# Patient Record
Sex: Female | Born: 1965
Health system: Southern US, Community
[De-identification: ages and names within clinical notes are randomized; demographics above are authoritative.]

## PROBLEM LIST (undated history)

## (undated) DIAGNOSIS — E7800 Pure hypercholesterolemia, unspecified: Secondary | ICD-10-CM

## (undated) DIAGNOSIS — D239 Other benign neoplasm of skin, unspecified: Secondary | ICD-10-CM

## (undated) DIAGNOSIS — J329 Chronic sinusitis, unspecified: Secondary | ICD-10-CM

## (undated) DIAGNOSIS — K219 Gastro-esophageal reflux disease without esophagitis: Secondary | ICD-10-CM

## (undated) DIAGNOSIS — F329 Major depressive disorder, single episode, unspecified: Secondary | ICD-10-CM

## (undated) DIAGNOSIS — R51 Headache: Secondary | ICD-10-CM

## (undated) DIAGNOSIS — F411 Generalized anxiety disorder: Secondary | ICD-10-CM

## (undated) DIAGNOSIS — F3289 Other specified depressive episodes: Secondary | ICD-10-CM

## (undated) DIAGNOSIS — C801 Malignant (primary) neoplasm, unspecified: Secondary | ICD-10-CM

## (undated) DIAGNOSIS — A158 Other respiratory tuberculosis: Secondary | ICD-10-CM

## (undated) DIAGNOSIS — IMO0002 Reserved for concepts with insufficient information to code with codable children: Secondary | ICD-10-CM

## (undated) HISTORY — DX: Other specified depressive episodes: F32.89

## (undated) HISTORY — PX: TONSILLECTOMY: SUR1361

## (undated) HISTORY — DX: Other respiratory tuberculosis: A15.8

## (undated) HISTORY — DX: Chronic sinusitis, unspecified: J32.9

## (undated) HISTORY — DX: Generalized anxiety disorder: F41.1

## (undated) HISTORY — DX: Other benign neoplasm of skin, unspecified: D23.9

## (undated) HISTORY — DX: Gastro-esophageal reflux disease without esophagitis: K21.9

## (undated) HISTORY — DX: Malignant (primary) neoplasm, unspecified: C80.1

## (undated) HISTORY — PX: VARICOSE VEIN SURGERY: SHX832

## (undated) HISTORY — DX: Reserved for concepts with insufficient information to code with codable children: IMO0002

## (undated) HISTORY — DX: Pure hypercholesterolemia, unspecified: E78.00

## (undated) HISTORY — PX: ADENOIDECTOMY: SUR15

## (undated) HISTORY — DX: Headache: R51

## (undated) HISTORY — DX: Major depressive disorder, single episode, unspecified: F32.9

---
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
## 1998-01-17 HISTORY — PX: COLPOSCOPY: SHX161

## 1998-01-17 HISTORY — PX: VARICOSE VEIN SURGERY: SHX832

## 2003-01-18 DIAGNOSIS — IMO0002 Reserved for concepts with insufficient information to code with codable children: Secondary | ICD-10-CM

## 2003-01-18 DIAGNOSIS — R87619 Unspecified abnormal cytological findings in specimens from cervix uteri: Secondary | ICD-10-CM

## 2003-01-18 HISTORY — DX: Reserved for concepts with insufficient information to code with codable children: IMO0002

## 2003-01-18 HISTORY — DX: Unspecified abnormal cytological findings in specimens from cervix uteri: R87.619

## 2004-02-18 ENCOUNTER — Encounter: Payer: Self-pay | Admitting: Family Medicine

## 2004-02-18 LAB — CONVERTED CEMR LAB: Pap Smear: NORMAL

## 2005-11-03 ENCOUNTER — Ambulatory Visit: Payer: Self-pay | Admitting: Family Medicine

## 2005-12-13 ENCOUNTER — Ambulatory Visit: Payer: Self-pay | Admitting: Family Medicine

## 2006-01-09 ENCOUNTER — Ambulatory Visit: Payer: Self-pay | Admitting: Internal Medicine

## 2006-01-19 ENCOUNTER — Encounter: Payer: Self-pay | Admitting: Obstetrics & Gynecology

## 2006-01-19 ENCOUNTER — Ambulatory Visit: Payer: Self-pay | Admitting: Obstetrics & Gynecology

## 2006-09-22 ENCOUNTER — Ambulatory Visit: Payer: Self-pay | Admitting: Family Medicine

## 2006-09-22 DIAGNOSIS — A158 Other respiratory tuberculosis: Secondary | ICD-10-CM | POA: Insufficient documentation

## 2006-09-22 DIAGNOSIS — K219 Gastro-esophageal reflux disease without esophagitis: Secondary | ICD-10-CM | POA: Insufficient documentation

## 2006-09-22 DIAGNOSIS — E7800 Pure hypercholesterolemia, unspecified: Secondary | ICD-10-CM | POA: Insufficient documentation

## 2006-09-22 DIAGNOSIS — D239 Other benign neoplasm of skin, unspecified: Secondary | ICD-10-CM | POA: Insufficient documentation

## 2006-10-27 DIAGNOSIS — D239 Other benign neoplasm of skin, unspecified: Secondary | ICD-10-CM

## 2006-10-27 HISTORY — DX: Other benign neoplasm of skin, unspecified: D23.9

## 2006-12-22 ENCOUNTER — Encounter (INDEPENDENT_AMBULATORY_CARE_PROVIDER_SITE_OTHER): Payer: Self-pay | Admitting: *Deleted

## 2006-12-25 ENCOUNTER — Encounter (INDEPENDENT_AMBULATORY_CARE_PROVIDER_SITE_OTHER): Payer: Self-pay | Admitting: *Deleted

## 2006-12-26 LAB — CONVERTED CEMR LAB
Albumin: 4 g/dL (ref 3.5–5.2)
BUN: 12 mg/dL (ref 6–23)
Bilirubin, Direct: 0.1 mg/dL (ref 0.0–0.3)
CO2: 27 meq/L (ref 19–32)
Chloride: 107 meq/L (ref 96–112)
Cholesterol: 221 mg/dL (ref 0–200)
Creatinine, Ser: 0.7 mg/dL (ref 0.4–1.2)
Direct LDL: 150.8 mg/dL
GFR calc Af Amer: 120 mL/min
HDL: 32.5 mg/dL — ABNORMAL LOW (ref >39.0)
Total CHOL/HDL Ratio: 6.8
Triglycerides: 158 mg/dL — ABNORMAL HIGH (ref 0–149)

## 2007-02-07 ENCOUNTER — Ambulatory Visit: Payer: Self-pay | Admitting: Obstetrics & Gynecology

## 2007-02-07 ENCOUNTER — Encounter (INDEPENDENT_AMBULATORY_CARE_PROVIDER_SITE_OTHER): Payer: Self-pay | Admitting: Gynecology

## 2007-02-12 ENCOUNTER — Inpatient Hospital Stay (HOSPITAL_COMMUNITY): Admission: AD | Admit: 2007-02-12 | Discharge: 2007-02-12 | Payer: Self-pay | Admitting: Obstetrics and Gynecology

## 2007-02-20 ENCOUNTER — Ambulatory Visit (HOSPITAL_COMMUNITY): Admission: RE | Admit: 2007-02-20 | Discharge: 2007-02-20 | Payer: Self-pay | Admitting: Gynecology

## 2007-03-12 ENCOUNTER — Ambulatory Visit: Payer: Self-pay | Admitting: Gynecology

## 2007-03-21 ENCOUNTER — Encounter: Payer: Self-pay | Admitting: Family Medicine

## 2007-03-21 DIAGNOSIS — F411 Generalized anxiety disorder: Secondary | ICD-10-CM | POA: Insufficient documentation

## 2007-03-22 ENCOUNTER — Ambulatory Visit: Payer: Self-pay | Admitting: Family Medicine

## 2007-03-23 ENCOUNTER — Encounter: Payer: Self-pay | Admitting: Family Medicine

## 2007-05-07 ENCOUNTER — Telehealth: Payer: Self-pay | Admitting: Family Medicine

## 2007-10-02 ENCOUNTER — Ambulatory Visit: Payer: Self-pay | Admitting: Family Medicine

## 2007-11-02 DIAGNOSIS — D229 Melanocytic nevi, unspecified: Secondary | ICD-10-CM

## 2007-11-02 HISTORY — DX: Melanocytic nevi, unspecified: D22.9

## 2007-12-06 ENCOUNTER — Telehealth: Payer: Self-pay | Admitting: Family Medicine

## 2008-03-12 ENCOUNTER — Encounter: Payer: Self-pay | Admitting: Family Medicine

## 2008-03-12 ENCOUNTER — Ambulatory Visit: Payer: Self-pay | Admitting: Family Medicine

## 2008-03-17 ENCOUNTER — Encounter: Admission: RE | Admit: 2008-03-17 | Discharge: 2008-03-17 | Payer: Self-pay | Admitting: Family Medicine

## 2008-03-17 ENCOUNTER — Ambulatory Visit (HOSPITAL_COMMUNITY): Admission: RE | Admit: 2008-03-17 | Discharge: 2008-03-17 | Payer: Self-pay | Admitting: Obstetrics & Gynecology

## 2008-03-27 ENCOUNTER — Encounter: Admission: RE | Admit: 2008-03-27 | Discharge: 2008-03-27 | Payer: Self-pay | Admitting: Family Medicine

## 2008-07-29 ENCOUNTER — Ambulatory Visit: Payer: Self-pay | Admitting: Family Medicine

## 2008-08-06 ENCOUNTER — Encounter: Payer: Self-pay | Admitting: Family Medicine

## 2008-08-07 ENCOUNTER — Encounter: Admission: RE | Admit: 2008-08-07 | Discharge: 2008-08-07 | Payer: Self-pay | Admitting: Family Medicine

## 2008-08-07 ENCOUNTER — Ambulatory Visit: Payer: Self-pay | Admitting: Family Medicine

## 2008-09-24 ENCOUNTER — Encounter: Admission: RE | Admit: 2008-09-24 | Discharge: 2008-09-24 | Payer: Self-pay | Admitting: Family Medicine

## 2009-02-17 LAB — CONVERTED CEMR LAB: Pap Smear: NORMAL

## 2009-03-17 ENCOUNTER — Ambulatory Visit: Payer: Self-pay | Admitting: Obstetrics and Gynecology

## 2009-03-23 ENCOUNTER — Encounter: Admission: RE | Admit: 2009-03-23 | Discharge: 2009-03-23 | Payer: Self-pay | Admitting: Obstetrics and Gynecology

## 2009-09-01 ENCOUNTER — Ambulatory Visit: Payer: Self-pay | Admitting: Family Medicine

## 2009-09-01 DIAGNOSIS — R002 Palpitations: Secondary | ICD-10-CM | POA: Insufficient documentation

## 2009-09-22 ENCOUNTER — Encounter (INDEPENDENT_AMBULATORY_CARE_PROVIDER_SITE_OTHER): Payer: Self-pay | Admitting: *Deleted

## 2009-09-22 ENCOUNTER — Encounter: Admission: RE | Admit: 2009-09-22 | Discharge: 2009-09-22 | Payer: Self-pay | Admitting: Obstetrics and Gynecology

## 2009-09-29 ENCOUNTER — Ambulatory Visit: Payer: Self-pay | Admitting: Family Medicine

## 2009-10-28 ENCOUNTER — Telehealth: Payer: Self-pay | Admitting: Family Medicine

## 2009-11-17 ENCOUNTER — Ambulatory Visit: Payer: Self-pay | Admitting: Cardiovascular Disease

## 2010-02-16 NOTE — Progress Notes (Signed)
Summary: wants referral to cardiologist  Phone Note Call from Patient Call back at Home Phone [phone]   Caller: Patient Call For: Kerby Nora MD Summary of Call: Pt is asking for referral to cardiologist for palpitations.  She says she has already discussed this with you.  She prefers to see someone in [place].   Knows you are out till friday. Initial call taken by: Lowella Petties CMA,  October 28, 2009 4:57 PM

## 2010-02-16 NOTE — Assessment & Plan Note (Signed)
Summary: ROA FOR 1 MONTH FOLLOW-UP/JRR   Vital Signs:  Patient profile:   45 year old female Height:      64.5 inches Weight:      150 pounds BMI:     25.44 Temp:     97.9 degrees F oral Pulse rate:   72 / minute Pulse rhythm:   regular BP sitting:   112 / 72  (left arm) Cuff size:   regular  Vitals Entered By: Linde Gillis CMA Duncan Dull) (September 29, 2009 3:55 PM)  History of Present Illness: Chief complaint 1 month follow up  Anxiety, on 40 mg citalopram daily...she feels minimal improvement on higher dose. Sleeping well at night.    Palpitations: No further episodes in past month.  Has made spme change in diet, decrease caffeine.  Recently had [facility] done at [facility]  Total 233 triglycerides 233   Problems Prior to Update: 1)  Palpitations  (ICD-785.1) 2)  Headache, Occipital  (ICD-784.0) 3)  Depression  (ICD-311) 4)  Anxiety  (ICD-300.00) 5)  Nevus  (ICD-216.9) 6)  Gerd  (ICD-530.81) 7)  Postnasal Drip Syndrome  (ICD-473.9) 8)  Tb, Respiratory Nec, Histologic Dx  (ICD-012.85) 9)  Hypercholesterolemia  (ICD-272.0)  Current Medications (verified): 1)  Citalopram Hydrobromide 20 Mg  Tabs (Citalopram Hydrobromide) .... 2 By Mouth Once Daily  Allergies (verified): No Known Drug Allergies  Past History:  Past medical, surgical, family and social histories (including risk factors) reviewed, and no changes noted (except as noted below).  Past Medical History: Reviewed history from 03/21/2007 and no changes required. HEADACHE,? MIGRAINE (ICD-784.0) DEPRESSION (ICD-311) ANXIETY (ICD-300.00) NEVUS (ICD-216.9) GERD (ICD-530.81) POSTNASAL DRIP SYNDROME (ICD-473.9) TB, RESPIRATORY NEC, HISTOLOGIC DX (ICD-012.85) HYPERCHOLESTEROLEMIA (ICD-272.0)    Past Surgical History: Reviewed history from 03/21/2007 and no changes required. 2000    Varicose vein removal 1979     Tonsillectomy 2000     Colposcopy - abn. Pap.  Family History: Reviewed history from  08/07/2008 and no changes required. Father: Alive 56, HTN, DM, high chol. Mother: Alive 19, healthy Siblings: 1 brother, healthy No MI < 55 PGF:  Pancreatic CA MGF:  Oral CA   MGM: CAD, stents, in 63's  Social History: Reviewed history from 03/21/2007 and no changes required. Never Smoked Alcohol use-no Drug use-no Regular exercise-yes, walks daily Marital Status: Married x 13 years, no abuse Children: 3 kids, ages 51-10 Occupation: Homemaker Diet:  3 meals, limited fruit and veggies, sweet iced tea  Review of Systems General:  Complains of fatigue; denies fever. CV:  Denies chest pain or discomfort. Resp:  Denies shortness of breath. GI:  Denies abdominal pain. GU:  Denies dysuria.  Physical Exam  General:  Well-developed,well-nourished,in no acute distress; alert,appropriate and cooperative throughout examination Mouth:  Oral mucosa and oropharynx without lesions or exudates.  Teeth in good repair. Neck:  no carotid bruit or thyromegaly no cervical or supraclavicular lymphadenopathy  Lungs:  Normal respiratory effort, chest expands symmetrically. Lungs are clear to auscultation, no crackles or wheezes. Heart:  Normal rate and regular rhythm. S1 and S2 normal without gallop, murmur, click, rub or other extra sounds. Pulses:  R and L posterior tibial pulses are full and equal bilaterally  Extremities:  no edmea  Skin:  Intact without suspicious lesions or rashes Psych:  slightly anxious.  Oriented X3, memory intact for recent and remote, normally interactive, and good eye contact.     Impression & Recommendations:  Problem # 1:  ANXIETY (ICD-300.00) stable control on curent dose  med. if control worsens, she will consider changinf medicaiton. Discussed longterm paln for medication..she feels she will need to continue longterm given family history of anxiety issues.  Her updated medication list for this problem includes:    Citalopram Hydrobromide 20 Mg Tabs (Citalopram  hydrobromide) .Marland Kitchen... 2 by mouth once daily  Problem # 2:  PALPITATIONS (ICD-785.1) resolved.   Problem # 3:  HYPERCHOLESTEROLEMIA (ICD-272.0) Fish oil 2000 mg Divided daily  Decrease fatty foods.  Increase exercise. Increase almonds, change to olive oil/canola, try avacado. Change to 2% cheese, or mazarella fat free. Return for fasting lipids in 3 months.   Complete Medication List: 1)  Citalopram Hydrobromide 20 Mg Tabs (Citalopram hydrobromide) .... 2 by mouth once daily  Other Orders: Admin 1st Vaccine (34742) Flu Vaccine 54yrs + (59563)  Patient Instructions: 1)  Fish oil 2000 mg Divided daily 2)   Decrease fatty foods. 3)   Increase exercise. 4)  Increase almonds, change to olive oil/canola, try avacado. 5)  Change to 2% cheese, or mazarella fat free. 6)  Return for fasting lipids in 3 months.  7)  Folow up appt to disucss chol and anxiety in 3 months.  8)   GoaL LDL< 130-160. 9)   Conitnue citalopram.   Current Allergies (reviewed today): No known allergies   Last Mammogram:  BI-RADS CATEGORY 3:  Probably benign finding(s) - short interval^MM DIGITAL DIAGNOSTIC UNILAT L (09/24/2008 10:27:00 AM) Mammogram Result Date:  09/17/2009 Mammogram Result:  left abnormal Mammogram Next Due:  6 mo                    Flu Vaccine Consent Questions     Do you have a history of severe allergic reactions to this vaccine? no    Any prior history of allergic reactions to egg and/or gelatin? no    Do you have a sensitivity to the preservative Thimersol? no    Do you have a past history of Guillan-Barre Syndrome? no    Do you currently have an acute febrile illness? no    Have you ever had a severe reaction to latex? no    Vaccine information given and explained to patient? yes    Are you currently pregnant? no    Lot Number:[id number]   Exp Date:07/17/2010   Site Given  Left Deltoid IM .lbflu

## 2010-02-16 NOTE — Assessment & Plan Note (Signed)
Summary: IRREGULAR HEARTBEAT/PALPITATIONS/DLO   Vital Signs:  Patient profile:   45 year old female Height:      64.5 inches Weight:      154.8 pounds BMI:     26.26 Temp:     97.9 degrees F oral Pulse rate:   72 / minute Pulse rhythm:   regular BP sitting:   120 / 78  (left arm) Cuff size:   regular  Vitals Entered By: Benny Lennert CMA Duncan Dull) (September 01, 2009 2:37 PM)  History of Present Illness: Chief complaint irregular heart beat and palpitations   Intermitant heart fluttering, pounding...last seconds to minutes.  Increasing frequency in last few months. Occuring daily.  Notices it most at night.  No clear triggers..minimal caffeine intake. No decongestant use. No relation to exercsie. No CP no SOB. No dizziness No sweating or other associated symptoms.  No change in EKG from comparison to 07/2008.  Mother and MGM  with MVP.   Hx of anxiety and depression, moderate control.? citalopram not helping as much as in past.   Problems Prior to Update: 1)  Ear Pain, Left  (ICD-388.70) 2)  Arm Pain, Left  (ICD-729.5) 3)  Neck Pain  (ICD-723.1) 4)  Headache, Occipital  (ICD-784.0) 5)  Uri  (ICD-465.9) 6)  Chest Pain, Atypical  (ICD-786.59) 7)  Headache,? Migraine  (ICD-784.0) 8)  Depression  (ICD-311) 9)  Anxiety  (ICD-300.00) 10)  Nevus  (ICD-216.9) 11)  Gerd  (ICD-530.81) 12)  Postnasal Drip Syndrome  (ICD-473.9) 13)  Tb, Respiratory Nec, Histologic Dx  (ICD-012.85) 14)  Hypercholesterolemia  (ICD-272.0)  Current Medications (verified): 1)  Citalopram Hydrobromide 20 Mg  Tabs (Citalopram Hydrobromide) .... 2 By Mouth Once Daily 2)  Multivitamins   Tabs (Multiple Vitamin) .... Once Daily (Sometimes)  Allergies (verified): No Known Drug Allergies PMH-FH-SH reviewed-no changes except otherwise noted  Review of Systems General:  Complains of fatigue; denies fever. CV:  Denies chest pain or discomfort and swelling of feet. Resp:  Denies shortness of  breath. GI:  Denies abdominal pain. GU:  Denies dysuria.  Physical Exam  General:  Well-developed,well-nourished,in no acute distress; alert,appropriate and cooperative throughout examination Mouth:  Oral mucosa and oropharynx without lesions or exudates.  Teeth in good repair. Neck:  no carotid bruit or thyromegaly no cervical or supraclavicular lymphadenopathy  Lungs:  Normal respiratory effort, chest expands symmetrically. Lungs are clear to auscultation, no crackles or wheezes. Heart:  Normal rate and regular rhythm. S1 and S2 normal without gallop, murmur, click, rub or other extra sounds. Abdomen:  Bowel sounds positive,abdomen soft and non-tender without masses, organomegaly or hernias noted. Pulses:  R and L posterior tibial pulses are full and equal bilaterally  Extremities:  no edmea  Skin:  Intact without suspicious lesions or rashes Psych:  slightly anxious.     Impression & Recommendations:  Problem # 1:  PALPITATIONS (ICD-785.1)  Orders: EKG w/ Interpretation (93000): No arrythmia, no ST changes, no Q, no LVH Recommended decreasing caffeine, no decongestants. Will eval with labs for anemia, thyroid issues as well as CV risk factors such as chol and DM screen.  Treat anxiety as below. If  palpitations/skipped beats continuing...refer to cardiology for Holter Monitor.   Problem # 2:  ANXIETY (ICD-300.00) Moderate control. Increse citalopram to 40 mg daily. Follow up in 1 month. Work on stress reduction, relaxation.  Her updated medication list for this problem includes:    Citalopram Hydrobromide 20 Mg Tabs (Citalopram hydrobromide) .Marland Kitchen... 2 by mouth once daily  Complete Medication List: 1)  Citalopram Hydrobromide 20 Mg Tabs (Citalopram hydrobromide) .... 2 by mouth once daily 2)  Multivitamins Tabs (Multiple vitamin) .... Once daily (sometimes)  Patient Instructions: 1)  Avoid caffeine, decongestants 2)   Increase fluid intake of water. 3)   Increase to 40 mg daily  of citalopram. 4)  Schedule fasting labs... CMET, LIPIDs, TSH, cbc Dx 785.1 5)  Please schedule a follow-up appointment in 1 month.   Current Allergies (reviewed today): No known allergies   Last PAP:  Normal (02/18/2004 5:09:56 PM) PAP Result Date:  02/17/2009 PAP Result:  normal PAP Next Due:  1 yr

## 2010-02-16 NOTE — Letter (Signed)
Summary: [place] No Show Letter  [place] [facility]  [address] [place], [place] [address]   Phone: [phone]  Fax: [phone]    09/22/2009 MRN: [id number]  Janet Porter [address] [place], [place]  [address]   Dear [name],   Our records indicate that you missed your scheduled appointment with ___lab__________________ on __9.6.11__________.  Please contact this office to reschedule your appointment as soon as possible.  It is important that you keep your scheduled appointments with your physician, so we can provide you the best care possible.  Please be advised that there may be a charge for "no show" appointments.    Sincerely,   [place] [facility]

## 2010-02-16 NOTE — Assessment & Plan Note (Signed)
Summary: palpitations      Allergies Added: NKDA  Visit Type:  Initial Consult Primary Provider:  Kerby Nora MD  CC:  c/o fluttering in chest with pounding at times.  Denies shortness of breath..  History of Present Illness: Ms. Janet Porter is a very pleasant 45 year old woman with a history of anxiety who presents for inpatient evaluation for symptoms of irregular heartbeat and palpitations.  She reports that this year, she has noticed palpitations. She describes it as a fluttering, sometimes as a pounding, other times as a flip flopping. She is not had any this week. She notices them more at nighttime though occasionally during the daytime. She has significant stress in her life, though also describes herself as a person who stresses about almost everything. She denies heavy caffeine use, cold medicines, smoking. She is otherwise active and has no symptoms of shortness of breath or chest discomfort. No significant lower extremity edema. She is otherwise healthy.  Her parents have no significant cardiac history her father does have high cholesterol.  She reports that her cholesterol is elevated and notes indicate a total cholesterol of 220 in 2008. She has a "sweet tooth ".  EKG shows normal sinus rhythm with rate 82 beats per minute, no significant ST or T wave changes  Current Medications (verified): 1)  Citalopram Hydrobromide 20 Mg  Tabs (Citalopram Hydrobromide) .... 2 By Mouth Once Daily  Allergies (verified): No Known Drug Allergies  Past History:  Past Medical History: Last updated: 03/21/2007 HEADACHE,? MIGRAINE (ICD-784.0) DEPRESSION (ICD-311) ANXIETY (ICD-300.00) NEVUS (ICD-216.9) GERD (ICD-530.81) POSTNASAL DRIP SYNDROME (ICD-473.9) TB, RESPIRATORY NEC, HISTOLOGIC DX (ICD-012.85) HYPERCHOLESTEROLEMIA (ICD-272.0)    Past Surgical History: Last updated: 03/21/2007 2000    Varicose vein removal 1979     Tonsillectomy 2000     Colposcopy - abn. Pap.  Family  History: Last updated: 08/07/2008 Father: Alive 54, HTN, DM, high chol. Mother: Alive 7, healthy Siblings: 1 brother, healthy No MI < 55 PGF:  Pancreatic CA MGF:  Oral CA   MGM: CAD, stents, in 94's  Social History: Last updated: 03/21/2007 Never Smoked Alcohol use-no Drug use-no Regular exercise-yes, walks daily Marital Status: Married x 13 years, no abuse Children: 3 kids, ages 59-10 Occupation: Homemaker Diet:  3 meals, limited fruit and veggies, sweet iced tea  Risk Factors: Exercise: yes (03/21/2007)  Risk Factors: Smoking Status: never (03/21/2007)  Review of Systems  The patient denies fever, weight loss, weight gain, vision loss, decreased hearing, hoarseness, chest pain, syncope, dyspnea on exertion, peripheral edema, prolonged cough, abdominal pain, incontinence, muscle weakness, depression, and enlarged lymph nodes.         palpitations, fluttering  Vital Signs:  Patient profile:   45 year old female Height:      64.5 inches Weight:      152.25 pounds BMI:     25.82 Pulse rate:   87 / minute BP sitting:   144 / 85  (left arm) Cuff size:   regular  Vitals Entered By: Bishop Dublin, CMA (November 17, 2009 3:45 PM)  Physical Exam  General:  Well developed, well nourished, in no acute distress. Head:  normocephalic and atraumatic Neck:  Neck supple, no JVD. No masses, thyromegaly or abnormal cervical nodes. Lungs:  Clear bilaterally to auscultation and percussion. Heart:  Non-displaced PMI, chest non-tender; regular rate and rhythm, S1, S2 without murmurs, rubs or gallops. Carotid upstroke normal, no bruit.  Pedals normal pulses. No edema, no varicosities. Abdomen:  Bowel sounds positive;  abdomen soft and non-tender without masses Msk:  Back normal, normal gait. Muscle strength and tone normal. Pulses:  pulses normal in all 4 extremities Extremities:  No clubbing or cyanosis. Neurologic:  Alert and oriented x 3. Skin:  Intact without lesions or  rashes. Psych:  Normal affect.   Impression & Recommendations:  Problem # 1:  PALPITATIONS (ICD-785.1) history of sporadic palpitations starting this year. Improved recently. I did discuss the options for her including watchful waiting, treatment with a low-dose beta blocker for symptom relief or a Holter monitor for 24 or 48 hours to identify her underlying arrhythmia. I suspect she is having either APCs or VPCs which are typically benign in nature. We did offer a monitor if she would like and beta blockers to be taken on an as needed basis 4 days with significant palpitations. She preferred to hold off on any testing at this time and did not want medications at this time. I have asked her to contact me If she has worsening symptoms and quickly we could organize a Holter monitor or give her samples of beta blockers if needed.  Orders: EKG w/ Interpretation (93000)  Problem # 2:  HYPERCHOLESTEROLEMIA (ICD-272.0) resistance and on talking about her cholesterol. Her mildly elevated last roll is likely familial. She's not significantly obese. She does not have other risk factors such as smoking or diabetes. I did suggest that she could try aggressive diet and exercise first in March for her cholesterol with a goal total cholesterol less than 200.  Alternatively, she could take a low-dose generic statin if she chose which would likely offer significantly improved lipid control. Past surgical and tachypnea she like me to help her started low dose statin medication such as simvastatin or Lipitor when it goes generic.

## 2010-03-09 ENCOUNTER — Other Ambulatory Visit: Payer: Self-pay | Admitting: Family Medicine

## 2010-03-09 DIAGNOSIS — Z1231 Encounter for screening mammogram for malignant neoplasm of breast: Secondary | ICD-10-CM

## 2010-03-29 ENCOUNTER — Ambulatory Visit
Admission: RE | Admit: 2010-03-29 | Discharge: 2010-03-29 | Disposition: A | Discharge status: Home / Self Care | Payer: BC Managed Care – PPO | Source: Ambulatory Visit | Attending: Family Medicine | Admitting: Family Medicine

## 2010-03-29 DIAGNOSIS — Z1231 Encounter for screening mammogram for malignant neoplasm of breast: Secondary | ICD-10-CM

## 2010-04-14 ENCOUNTER — Ambulatory Visit (INDEPENDENT_AMBULATORY_CARE_PROVIDER_SITE_OTHER): Payer: BC Managed Care – PPO | Admitting: Obstetrics & Gynecology

## 2010-04-14 DIAGNOSIS — Z113 Encounter for screening for infections with a predominantly sexual mode of transmission: Secondary | ICD-10-CM

## 2010-04-14 DIAGNOSIS — Z1272 Encounter for screening for malignant neoplasm of vagina: Secondary | ICD-10-CM

## 2010-04-14 DIAGNOSIS — Z01419 Encounter for gynecological examination (general) (routine) without abnormal findings: Secondary | ICD-10-CM

## 2010-04-20 NOTE — Assessment & Plan Note (Signed)
Janet Porter, Janet Porter               ACCOUNT NO.:  [id number]  MEDICAL RECORD NO.:  [id number]           PATIENT TYPE:  LOCATION:  [facility]           FACILITY:  PHYSICIAN:  Tinnie Gens, MD             DATE OF BIRTH:  DATE OF SERVICE:                                 CLINIC NOTE  CHIEF COMPLAINT:  Yearly exam and Pap.  HISTORY OF PRESENT ILLNESS:  The patient is a 45 year old gravida 3, para 3, who comes in today for annual exam.  She has no any specific complaint.  She is sexually active.  She and her husband use condoms for birth control.  She has regular cycles with no heavy bleeding.  PAST MEDICAL HISTORY:  Negative.  PAST SURGICAL HISTORY:  She had tonsillectomy and adenoidectomy and varicose vein treatment.  MEDICATIONS:  She is on citalopram 20 mg daily and multivitamin once daily.  OBSTETRICAL HISTORY:  She has three previous vaginal deliveries.  GYN HISTORY:  History of abnormal Pap in 2005 with colpo and normal Pap since that time.  SOCIAL HISTORY:  She cleans houses for living.  She lives with her husband and three kids.  She does not smoke, drink, or use any other drugs.  REVIEW OF SYSTEMS:  Fourteen-point review of systems is reviewed.  She denies headache, vision changes, sore throat, nausea, vomiting, diarrhea, constipation, abdominal pain, blood in her stools, blood in her urine, masses, dysuria, vaginal discharge, swelling of feet or ankles, fevers or chills.  PHYSICAL EXAMINATION:  VITAL SIGNS:  Her blood pressure today is 129/75 and weight 149. GENERAL:  She is a well-developed, well-nourished female in no acute distress. LUNGS:  Clear bilaterally. HEENT:  Normocephalic and atraumatic.  Sclerae anicteric. NECK:  Supple.  Normal thyroid. LUNGS:  Clear bilaterally. CV:  Regular rate and rhythm without gallops, murmurs,or rubs. ABDOMEN:  Soft, nontender, nondistended. EXTREMITIES:  She has no cyanosis, clubbing, or edema.  2+  distal pulses. BREASTS:  Symmetric with everted nipples.  No masses.  No supraclavicular or axillary adenopathy. GU:  Normal external female genitalia.  BUS is normal.  Vagina is pink and rugated.  Cervix is parous without lesion.  Adnexa without masses or tenderness.  IMPRESSION:  GYN exam with Pap.  PLAN: 1. Pap smear today. 2. She actually had a mammogram 2 weeks ago.  We will try to obtain     report of this.  She has a primary care physician who does her     yearly labs, Dr. Ermalene Searing, and we will defer those to her, although     she does have a history of elevated cholesterol and potentially     needs followup of that.  Additionally, she is always needing every     6 months interval mammography because of breast thickening in the     area.          ______________________________ Tinnie Gens, MD    TP/MEDQ  D:  04/14/2010  T:  04/15/2010  Job:  [id number]

## 2010-06-01 NOTE — Assessment & Plan Note (Signed)
Janet Porter, STAUFFER NO.:  [id number]   MEDICAL RECORD NO.:  [id number]          PATIENT TYPE:  POB   LOCATION:  [facility]         FACILITY:  [facility]   PHYSICIAN:  Catalina Antigua, MD     DATE OF BIRTH:  [birth year]   DATE OF SERVICE:                                  CLINIC NOTE   This is a 45 year old G3, P3 who presents today for annual exam.  The  patient is currently without any complaints.  She denies any abnormal  bleeding or discharge.  The patient is sexually active with her husband  and is using condoms for birth control.   PAST MEDICAL HISTORY:  She denies.   PAST SURGICAL HISTORY:  Significant for varicose vein and T and A.   PAST OBSTETRICAL HISTORY:  She has had 3 vaginal deliveries.   PAST GYNECOLOGIC HISTORY:  She denies any cyst or fibroid.  She does  have a history of abnormal Pap smear in 2005 which was treated with  colposcopy and has been normal since.   SOCIAL HISTORY:  She denies drinking, smoking or any use of illicit  drugs.   FAMILY HISTORY:  Significant for diabetes, hypertension and colon cancer  in her grandparents.   REVIEW OF SYSTEMS:  Otherwise within normal limits.   PHYSICAL EXAMINATION:  VITAL SIGNS:  Her blood pressure is 102/72, pulse  of 76, weight of 153 pounds, height of 5 feet 3 inches.  LUNGS:  Clear to auscultation bilaterally.  HEART:  Regular rate and rhythm.  BREASTS:  Equal in size.  No palpable masses or tenderness or  lymphadenopathy.  No expressible nipple discharge.  No skin dimpling.  ABDOMEN:  Soft, nontender, nondistended.  PELVIC:  She had normal vaginal mucosa and normal-appearing cervix.  No  abnormal bleeding or discharge.  On bimanual exam, she has small  palpable uterus.  No palpable adnexal masses or tenderness.   ASSESSMENT AND PLAN:  This is a 45 year old gravida 3, para 3 with LMP  of February 24, 2009, who presents for annual exam.  Pap smear was  performed.  The patient already  has a 11-month followup appointment for  her  mammogram next Monday secondary to BIRADS 3 found in September 2010.  The patient will be contacted with any abnormal results and will return  in a year or p.r.n.           ______________________________  Catalina Antigua, MD     PC/MEDQ  D:  03/17/2009  T:  03/18/2009  Job:  [id number]

## 2010-06-01 NOTE — Assessment & Plan Note (Signed)
Janet Porter, Janet Porter NO.:  [id number]   MEDICAL RECORD NO.:  [id number]          PATIENT TYPE:  POB   LOCATION:  [facility] at [facility]         FACILITY:  [facility]   PHYSICIAN:  Tinnie Gens, MD        DATE OF BIRTH:  08-21-1965   DATE OF SERVICE:                                  CLINIC NOTE   CHIEF COMPLAINT:  Physical exam.   HISTORY OF PRESENT ILLNESS:  The patient is a 45 year old para 3,  gravida 3, para 3, who comes today for yearly exam.  She has moderately  heavy cycles over the last 7-10 days.  She has a known endometrial polyp  with some biopsy that was normal.  She was evaluated with a  sonohystogram in the past.  She reports that her bleeding may or may not  be too bothersome to her.  She currently uses condoms for birth control  and was without significant other complaints.   PAST MEDICAL HISTORY:  Negative.   PAST SURGICAL HISTORY:  Tonsils, adenoids, and varicose vein surgery.   MEDICATIONS:  She is on citalopram 20 mg p.o. daily.   ALLERGIES:  None known.   OBSTETRICAL HISTORY:  G3, P3, 3 vaginal deliveries.   GYN HISTORY:  Menarche at age 36.  Cycles are monthly, last 7-10 days.  She does have a history of abnormal Pap.  She had a colposcopy and a  repeat Pap in June 2005 or in 2003.  Her last mammogram in 2005 was  normal.   FAMILY HISTORY:  Significant for diabetes, hypertension, and colon  cancer in her grandparents.   SOCIAL HISTORY:  She cleans houses.  She lives with her husband and 3  kids.  She does not smoke, drink, or use any other drugs.  A 14-point  review of systems reviewed.  She denies headache, vision changes, sore  throat, nausea, vomiting, diarrhea, constipation, or abdominal pain.  She has occasional IBS type symptoms with constipation predominant, but  not anything that is different for her.  No blood in her stools or blood  in urine.  No dysuria.  No vaginal discharge and swelling of feet or  ankles.  No breast masses  or tenderness.   PHYSICAL EXAMINATION:  VITAL SIGNS:  Her vitals are as noted in the  chart.  Weight is 150, and blood pressure 124/79.  GENERAL:  She is a well-developed and well-nourished female in no acute  distress.  ABDOMEN:  Soft.  HEENT:  Normocephalic, atraumatic.  Sclerae are anicteric.  NECK:  Supple.  Normal thyroid.  LUNGS:  Clear bilaterally.  CV:  Regular rate and rhythm.  No rubs, gallops, or murmur.  ABDOMEN:  Soft, nontender, and nondistended.  GU:  Normal external female genitalia.  BUS normal.  Vagina is pink and  rugated.  Cervix is nulliparous without lesion.  Uterus is small,  anteverted.  No adnexal tenderness, but the adnexa are somewhat  thickened, but not significantly enlarged.  BREASTS:  Symmetric with everted nipples.  No masses.  No  supraclavicular or axillary adenopathy.   IMPRESSION:  1. GYN exam.  2. History of endometrial polyp with some mildly heavy  vaginal      bleeding, status post endometrial sampling and sonohystogram a year      ago.   PLAN:  1. We will repeat the sonohysto today or this week.  The patient will      let us know if the bleeding continues to be a problem, we would      consider a D and C for her to treat her polyp.  Otherwise, she will      follow up as needed.  2. Needs a mammogram this year.  We will schedule.           ______________________________  Tinnie Gens, MD     TP/MEDQ  D:  03/12/2008  T:  03/12/2008  Job:  [id number]

## 2010-06-04 NOTE — Assessment & Plan Note (Signed)
Janet Porter NO.:  [id number]   MEDICAL RECORD NO.:  [id number]          PATIENT TYPE:  POB   LOCATION:  [facility] at [facility]         FACILITY:  [facility]   PHYSICIAN:  Elsie Lincoln, MD      DATE OF BIRTH:  1965-02-19   DATE OF SERVICE:                                  CLINIC NOTE   The patient is a 45 year old G3 P3 female who has no complaints today.  She needs a Pap smear done.  She is in a monogamous relationship with  her husband, and they use condoms for contraception.  Her children are  3, 7, and 10.   PAST MEDICAL HISTORY:  Denies all medical problems including diabetes  and high blood pressure and asthma, as well as depression and anxiety.   PAST SURGICAL HISTORY:  Tonsils and adenoids removed, and also, she had  a varicose vein surgery in 200.   GYN HISTORY:  NSVD x3.  Regular menses.  Condoms for contraception.  She  had an abnormal Pap smear in 2003 and received colporrhaphy.  Pap has  been normal since.  She also had a mammogram in 2005 for right breast  thickening, which I do feel today, and she was normal.   FAMILY HISTORY:  Dad has diabetes and hypertension and one grandparent  had colon cancer.   REVIEW OF SYSTEMS:  Numbness and weakness in fingers, fatigue, frequent  headaches, and loss of urine with coughing and sneezing.  She did not  feel like these need to be addressed right now.  If she wants to, her  loss of urine when coughing and sneezing can be addressed at a later  visit. All the other complaints need to be addressed by her primary care  physician.   MEDICATIONS:  Citalopram.   ALLERGIES:  None.   PHYSICAL EXAMINATION:  VITAL SIGNS:  Pulse 69, blood pressure 120/85,  weight 148 pounds, height 5 feet 3 inches.  GENERAL: Well-nourished, well-developed, no apparent distress.  NECK:  Thyroid without masses.  LUNGS: Clear to auscultation bilaterally.  HEART: Regular rate and rhythm.  ABDOMEN:  Soft and non-tender.  No  hernia.  No organomegaly.  BREAST EXAMINATION: No skin changes, nipple discharge, lymphadenopathy.  However, I do feel this right increased thickening at about 11:30 in the  right upper outer quadrant of the right breast.  GENITALIA:  Tanner 5.  Vagina pink, normal rugae.  BLADDER:  Well-suspended.  UTERUS:  Well-suspended.  CERVIX:  Closed, non-tender, with large ectropion.  Uterus feels  slightly retroverted.  ADNEXA:  No masses, non-tender.  RECTUM:  No hemorrhoids.  EXTREMITIES:  Non-tender.   ASSESSMENT/PLAN:  45 year old female for yearly examination.  1. Pap smear.  2. Cholesterol and BMP.  3. Mammogram/ultrasound right breast secondary to thickening.  We'll      send her to [facility] for this.  4. Return to clinic in 4 weeks to review results.           ______________________________  Elsie Lincoln, MD     KL/MEDQ  D:  01/19/2006  T:  01/19/2006  Job:  [id number]

## 2010-06-04 NOTE — Assessment & Plan Note (Signed)
Janet Porter                             [facility] OFFICE NOTE   Janet Porter, Janet Porter                      MRN:          [id number]  DATE:11/03/2005                            DOB:          04-Aug-1965    CHIEF COMPLAINT:  A 45 year old female here to establish new doctor.   HISTORY OF PRESENT ILLNESS:  Janet Porter recently moved from [facility] in June  2007 to this area.  She is here to set up a new doctor.  She was treated at  her previous doctors for depression and anxiety.  She states that she does  much better when she is on the citalopram 20 mg daily.  She has been out of  it for a month and states that she has been fairly nervous and fatigued.  She denies any really sad feelings, tearful episodes, overwhelming guilt, or  suicidal ideations.  She just states that she feels 100% better on the  medications and is able to communicate with people better.   She also states that she has some intermittent problems with headache.  She  has about 1 severe headache per month over the entire right side of her  head.  She has more mild to moderate headaches about 1 per week.  Occasionally she has nausea with the headaches and occasional photophobia.  She denies phonophobia.  She denies blurred vision, weakness, incontinence,  or numbness, or preceding aura.   REVIEW OF SYSTEMS:  No vision changes, no hearing problems, no dyspnea, no  chest pain, no palpitations, no nausea or vomiting, diarrhea, constipation,  or rectal bleeding.  No irregular menstrual cycle.  Occasional back pain.   PAST MEDICAL HISTORY:  1. Headaches, questionable migraine.  2. Depression/anxiety.   HOSPITALIZATIONS, SURGERIES, PROCEDURES:  1. 2000 varicose vein removal.  2. 1979 tonsillectomy.  3. 2000 colposcopy for an abnormal Pap.  4. Mammogram 2004 within normal limits.  5. Pap smear February 2006 within normal limits.   ALLERGIES:  None.   MEDICATIONS:  1. Citalopram 20  mg daily.  2. Multivitamins daily.   FAMILY HISTORY:  Father alive at age 17 with hypertension, diabetes, and  high cholesterol.  Mother alive at age 66, healthy.  No family history of  MI.  She has 1 brother, who is healthy.  She has a paternal grandfather with  pancreatic cancer and a maternal grandfather with oral cancer.   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  She has been married for 13 years and denies  domestic abuse.  She denies tobacco, alcohol, or drug use.  She walks daily  for exercise.  She eats 3 meals a day, but still gets limited fruits and  vegetables.  She drinks green iced tea as her most-common drink and  occasionally gets water.   PHYSICAL EXAM:  GENERAL:  Healthy-appearing young female in no apparent  distress.  VITAL SIGNS:  Height 64-and-a-half inches, weight 146, making BMI around 25.  Pulse 84, temperature 97.9.  HEENT:  PERRLA.  Extraocular muscles are intact.  Oropharynx clear.  Tympanic membranes clear.  No thyromegaly.  No lymphadenopathy  supraclavicular or cervical.  No papilledema.  PSYCH:  Appropriate affect.  No suicidal or homicidal ideation.  CARDIOVASCULAR:  Regular rate and rhythm.  No murmurs, rubs, or gallops.  PULMONARY:  Clear to auscultation bilaterally.  No wheeze, rales, or  rhonchi.  ABDOMEN:  Soft and nontender.  Normoactive bowel sounds.  No  hepatosplenomegaly.  NEURO:  Alert and oriented x3.  Cranial nerves 2-12 are intact.  Cerebellar  exam intact, gait within normal limits, reflexes 2+ peripheral patellar.  MUSCULOSKELETAL:  5/5 in upper and lower extremities.   ASSESSMENT AND PLAN:  1. Depression/anxiety, moderate control:  We will restart her on      citalopram 20 mg daily.  She was given a refill for this.  We also      discussed considering relaxation therapy and seeing a counselor for      permanent improvement in her anxiety.  She states that she has done       this in the past and it did not help.  2. Headaches:  Her headache sounds like tension versus migraine.  She will      keep a headache diary over the next few weeks.  We will have her follow      up to determine whether she needs prophylaxis for migraines.  She was      also given information about triggers of migraines and she will be on      the lookout for these.  3. Prevention.  She is due for Pap smear.  She will schedule this at her      next convenience.  SHE may also be due for a cholesterol panel, but I      will obtain her records from her previous doctor.  I am also unsure as      to whether she had a tetanus done.  She refused flu shot today.  She      will return for a headache followup after the receipt of her records.      Kerby Nora, MD    AB/MedQ  DD:  11/03/2005  DT:  11/06/2005  Job #:  [id number]

## 2010-07-09 ENCOUNTER — Encounter: Payer: Self-pay | Admitting: Family Medicine

## 2010-07-09 ENCOUNTER — Ambulatory Visit (INDEPENDENT_AMBULATORY_CARE_PROVIDER_SITE_OTHER): Payer: BC Managed Care – PPO | Admitting: Family Medicine

## 2010-07-09 VITALS — BP 126/82 | HR 76 | Temp 98.3°F | Ht 63.0 in | Wt 149.1 lb

## 2010-07-09 DIAGNOSIS — J329 Chronic sinusitis, unspecified: Secondary | ICD-10-CM | POA: Insufficient documentation

## 2010-07-09 MED ORDER — FLUTICASONE PROPIONATE 50 MCG/ACT NA SUSP: 2.0000 | Freq: Every day | NASAL | Status: DC

## 2010-07-09 NOTE — Assessment & Plan Note (Signed)
Likely viral sinusitis for now. Supportive care for now.  flonase for sinus congestion. If worsening or if not better by mid next week, treat as bacterial.

## 2010-07-09 NOTE — Progress Notes (Signed)
  Subjective:    Patient ID: Janet Porter, female    DOB: 06/18/1965, 45 y.o.   MRN: [id number]  HPI CC: sinus, cough  5d h/o cold sxs.  Started as cold, now going into head.  Trouble sleeping 2/2 headache and worse with cough, bending over.  + nasal congestion and discharge.  Seems to be getting better.  Cough improving, dry.  So far has tried [facility] sinus pain/congestion.  No fevers/chills, abd pain, nausea/vomiting.  No sick contacts at home, no smokers at home.  No h/o asthma.  Review of Systems Per HPI    Objective:   Physical Exam  Nursing note and vitals reviewed. Constitutional: She appears well-developed and well-nourished. No distress.  HENT:  Head: Normocephalic and atraumatic.  Right Ear: Hearing, external ear and ear canal normal.  Left Ear: Hearing, external ear and ear canal normal.  Nose: No mucosal edema or rhinorrhea. Right sinus exhibits no maxillary sinus tenderness and no frontal sinus tenderness. Left sinus exhibits no maxillary sinus tenderness and no frontal sinus tenderness.  Mouth/Throat: Oropharynx is clear and moist and mucous membranes are normal. No oropharyngeal exudate, posterior oropharyngeal edema, posterior oropharyngeal erythema or tonsillar abscesses.       Congested behind both TMs.  Eyes: Conjunctivae and EOM are normal. Pupils are equal, round, and reactive to light. No scleral icterus.  Neck: Normal range of motion. Neck supple.  Cardiovascular: Normal rate, regular rhythm, normal heart sounds and intact distal pulses.   No murmur heard. Pulmonary/Chest: Effort normal and breath sounds normal. No respiratory distress. She has no wheezes. She has no rales.  Lymphadenopathy:    She has no cervical adenopathy.  Skin: Skin is warm and dry. No rash noted.  Psychiatric: She has a normal mood and affect.          Assessment & Plan:

## 2010-07-09 NOTE — Patient Instructions (Signed)
Could be beginnings of sinus infection, most are viral. Take ibuprofen scheduled 400mg  every 6-8 hours for next 2-3 days with food. Push fluids and plenty of rest. Nasal saline irrigation or neti pot to help drain sinuses. Sent in flonase to pharmacy for nasal congestion if nasal saline not helping. May use simple mucinex or immediate release guaifenesin with plenty of fluid to help mobilize mucous. Return if fever >101.5, trouble opening/closing mouth, difficulty swallowing, or worsening.  If not better by mid next week, call us with update.

## 2010-10-07 LAB — WET PREP, GENITAL
Clue Cells Wet Prep HPF POC: NONE SEEN
Trich, Wet Prep: NONE SEEN
Yeast Wet Prep HPF POC: NONE SEEN

## 2010-10-07 LAB — GC/CHLAMYDIA PROBE AMP, GENITAL
Chlamydia, DNA Probe: NEGATIVE
GC Probe Amp, Genital: NEGATIVE

## 2010-10-07 LAB — POCT PREGNANCY, URINE
Operator id: 12584
Preg Test, Ur: NEGATIVE

## 2010-10-07 LAB — CBC
RBC: 4.39
WBC: 9.5

## 2010-12-08 ENCOUNTER — Ambulatory Visit (INDEPENDENT_AMBULATORY_CARE_PROVIDER_SITE_OTHER): Payer: BC Managed Care – PPO | Admitting: *Deleted

## 2010-12-08 DIAGNOSIS — Z23 Encounter for immunization: Secondary | ICD-10-CM

## 2010-12-23 ENCOUNTER — Other Ambulatory Visit: Payer: Self-pay | Admitting: Family Medicine

## 2011-03-02 ENCOUNTER — Other Ambulatory Visit: Payer: Self-pay | Admitting: Family Medicine

## 2011-03-02 DIAGNOSIS — Z1231 Encounter for screening mammogram for malignant neoplasm of breast: Secondary | ICD-10-CM

## 2011-03-30 ENCOUNTER — Ambulatory Visit
Admission: RE | Admit: 2011-03-30 | Discharge: 2011-03-30 | Disposition: A | Discharge status: Home / Self Care | Payer: BC Managed Care – PPO | Source: Ambulatory Visit | Attending: Family Medicine | Admitting: Family Medicine

## 2011-03-30 DIAGNOSIS — Z1231 Encounter for screening mammogram for malignant neoplasm of breast: Secondary | ICD-10-CM

## 2011-03-31 ENCOUNTER — Other Ambulatory Visit: Payer: Self-pay | Admitting: Family Medicine

## 2011-03-31 DIAGNOSIS — R928 Other abnormal and inconclusive findings on diagnostic imaging of breast: Secondary | ICD-10-CM

## 2011-04-08 ENCOUNTER — Ambulatory Visit
Admission: RE | Admit: 2011-04-08 | Discharge: 2011-04-08 | Disposition: A | Discharge status: Home / Self Care | Payer: BC Managed Care – PPO | Source: Ambulatory Visit | Attending: Family Medicine | Admitting: Family Medicine

## 2011-04-08 DIAGNOSIS — R928 Other abnormal and inconclusive findings on diagnostic imaging of breast: Secondary | ICD-10-CM

## 2011-05-03 ENCOUNTER — Ambulatory Visit (INDEPENDENT_AMBULATORY_CARE_PROVIDER_SITE_OTHER): Payer: BC Managed Care – PPO | Admitting: Obstetrics & Gynecology

## 2011-05-03 ENCOUNTER — Encounter: Payer: Self-pay | Admitting: Obstetrics & Gynecology

## 2011-05-03 VITALS — BP 118/80 | HR 93 | Ht 63.0 in | Wt 153.0 lb

## 2011-05-03 DIAGNOSIS — Z124 Encounter for screening for malignant neoplasm of cervix: Secondary | ICD-10-CM

## 2011-05-03 DIAGNOSIS — N951 Menopausal and female climacteric states: Secondary | ICD-10-CM

## 2011-05-03 DIAGNOSIS — Z01419 Encounter for gynecological examination (general) (routine) without abnormal findings: Secondary | ICD-10-CM

## 2011-05-03 NOTE — Progress Notes (Signed)
  Subjective:     Janet Porter is a 46 y.o. G3P3 female and is here for an annual gynecologic exam. The patient reports having mood swings late, especially the week before her period.  She also reports occasional hot flashes, no night sweats, no irregular menstrual bleeding, no vaginal dryness or abnormal discharge, no problems with intercourse. Patient is sexually active, uses condoms for birth control. No other gynecologic concerns. She had a normal mammogram in 04/08/2011.  History   Social History  . Marital Status: Married    Spouse Name: N/A    Number of Children: 3  . Years of Education: N/A   Occupational History  . Homemaker    Social History Main Topics  . Smoking status: Never Smoker   . Smokeless tobacco: Not on file  . Alcohol Use: No  . Drug Use: No  . Sexually Active: Yes -- Female partner(s)    Birth Control/ Protection: Condom   Other Topics Concern  . Not on file   Social History Narrative   Walks dailyMarried x 13 years, no abuse3 kids; ages 3-12Homemaker3 meals, limited fruits and veggies; sweet iced tea   Health Maintenance  Topic Date Due  . Influenza Vaccine  10/18/2011  . Pap Smear  02/18/2012  . Tetanus/tdap  01/18/2015   The following portions of the patient's history were reviewed and updated as appropriate: allergies, current medications, past family history, past medical history, past social history, past surgical history and problem list.  Review of Systems Pertinent items are noted in HPI.   Objective:   Blood pressure 118/80, pulse 93, height 5\' 3"  (1.6 m), weight 153 lb (69.4 kg), last menstrual period 04/28/2011. GENERAL: Well-developed, well-nourished female in no acute distress. Multiple nevi noted on body, patient is seen regularly by dermatologist HEENT: Normocephalic, atraumatic. Sclerae anicteric.  NECK: Supple. Normal thyroid.  LUNGS: Clear to auscultation bilaterally.  HEART: Regular rate and rhythm. BREASTS: Symmetric with everted  nipples. No masses, skin changes, nipple drainage, or lymphadenopathy. ABDOMEN: Soft, nontender, nondistended. No organomegaly. PELVIC: Normal external female genitalia. Vagina is pink and rugated.  Normal discharge. Normal cervix contour with prominent ectropion. Pap smear obtained. Uterus is normal in size. No adnexal mass or tenderness.  EXTREMITIES: No cyanosis, clubbing, or edema, 2+ distal pulses.   Assessment:    Healthy female exam. Perimenopausal symptoms.     Plan:   Will follow up pap and manage accordingly For her menopausal symptoms; discussed lifestyle interventions; also discussed medical therapy both hormonal and nonhormonal. Patient wishes to avoid hormones for now, is already on Celexa 20 mg daily but this is not helping.  Recommended switching to Effexor ER (Venlafaxine HCL ER) 75 mg daily to help with her depression, anxiety and her vasomotor symptoms; with the permission of her mental health provider.  She will consider this and call back if she wants this medication.  She was also given UpToDate patient education pamphlets about hormonal and nonhormonal treatments of perimenopausal symptoms to review at home.  Will continue to monitor her symptoms and manage accordingly as per the patient's decisions.  Continue routine gynecologic care

## 2011-05-03 NOTE — Patient Instructions (Signed)
Perimenopause  Perimenopause is the time when your body begins to move into the menopause (no menstrual period for 12 straight months). It is a natural process. Perimenopause can begin 2 to 8 years before the menopause and usually lasts for one year after the menopause. During this time, your ovaries may or may not produce an egg. The ovaries vary in their production of estrogen and progesterone hormones each month. This can cause irregular menstrual periods, difficulty in getting pregnant, vaginal bleeding between periods and uncomfortable symptoms.  CAUSES   Irregular production of the ovarian hormones, estrogen and progesterone, and not ovulating every month.   Other causes include:   Tumor of the pituitary gland in the brain.   Medical disease that affects the ovaries.   Radiation treatment.   Chemotherapy.   Unknown causes.   Heavy smoking and excessive alcohol intake can bring on perimenopause sooner.  SYMPTOMS     Hot flashes.   Night sweats.   Irregular menstrual periods.   Decrease sex drive.   Vaginal dryness.   Headaches.   Mood swings.   Depression.   Memory problems.   Irritability.   Tiredness.   Weight gain.   Trouble getting pregnant.   The beginning of losing bone cells (osteoporosis).   The beginning of hardening of the arteries (atherosclerosis).  DIAGNOSIS    Your caregiver will make a diagnosis by analyzing your age, menstrual history and your symptoms. They will do a physical exam noting any changes in your body, especially your female organs. Female hormone tests may or may not be helpful depending on the amount and when you produce the female hormones. However, other hormone tests may be helpful (ex. thyroid hormone) to rule out other problems.  TREATMENT    The decision to treat during the perimenopause should be made by you and your caregiver depending on how the symptoms are affecting you and your life style. There are various treatments available such as:    Treating individual symptoms with a specific medication for that symptom (ex. tranquilizer for depression).   Herbal medications that can help specific symptoms.   Counseling.   Group therapy.   No treatment.  HOME CARE INSTRUCTIONS     Before seeing your caregiver, make a list of your menstrual periods (when the occur, how heavy they are, how long between periods and how long they last), your symptoms and when they started.   Take the medication as recommended by your caregiver.   Sleep and rest.   Exercise.   Eat a diet that contains calcium (good for your bones) and soy (acts like estrogen hormone).   Do not smoke.   Avoid alcoholic beverages.   Taking vitamin E may help in certain cases.   Take calcium and vitamin D supplements to help prevent bone loss.   Group therapy is sometimes helpful.   Acupuncture may help in some cases.  SEEK MEDICAL CARE IF:     You have any of the above and want to know if it is perimenopause.   You want advice and treatment for any of your symptoms mentioned above.   You need a referral to a specialist (gynecologist, psychiatrist or psychologist).  SEEK IMMEDIATE MEDICAL CARE IF:     You have vaginal bleeding.   Your period lasts longer than 8 days.   You periods are recurring sooner than 21 days.   You have bleeding after intercourse.   You have severe depression.   You have pain   when you urinate.   You have severe headaches.   You develop vision problems.  Document Released: 02/11/2004 Document Revised: 12/23/2010 Document Reviewed: 11/01/2007  [employer] Patient Information 2012 [employer].

## 2011-07-16 ENCOUNTER — Emergency Department (HOSPITAL_COMMUNITY): Admission: EM | Admit: 2011-07-16 | Discharge: 2011-07-16 | Discharge status: Absent without leave | Payer: Self-pay

## 2011-09-14 ENCOUNTER — Other Ambulatory Visit: Payer: Self-pay | Admitting: *Deleted

## 2011-09-14 NOTE — Telephone Encounter (Signed)
Ok to refill? Has not been seen in over 1 year. 

## 2011-09-15 MED ORDER — CITALOPRAM HYDROBROMIDE 20 MG PO TABS: 20.0000 mg | ORAL_TABLET | Freq: Every day | ORAL | Status: DC

## 2011-09-15 NOTE — Telephone Encounter (Signed)
Spoke to patient she stated she will call and schedule appt for CPE.

## 2011-09-15 NOTE — Telephone Encounter (Signed)
Citlopram 20 mg called in to [facility] [address].

## 2011-09-15 NOTE — Telephone Encounter (Signed)
Have pt make appt for CPX, refill until then.

## 2011-12-19 ENCOUNTER — Other Ambulatory Visit: Payer: Self-pay | Admitting: *Deleted

## 2011-12-19 MED ORDER — CITALOPRAM HYDROBROMIDE 20 MG PO TABS: 20.0000 mg | ORAL_TABLET | Freq: Every day | ORAL | Status: DC

## 2012-01-24 ENCOUNTER — Encounter: Payer: Self-pay | Admitting: Family Medicine

## 2012-01-24 ENCOUNTER — Ambulatory Visit (INDEPENDENT_AMBULATORY_CARE_PROVIDER_SITE_OTHER): Payer: BC Managed Care – PPO | Admitting: Family Medicine

## 2012-01-24 VITALS — BP 104/70 | HR 95 | Temp 98.4°F | Ht 63.0 in | Wt 153.2 lb

## 2012-01-24 DIAGNOSIS — Z23 Encounter for immunization: Secondary | ICD-10-CM

## 2012-01-24 DIAGNOSIS — E7800 Pure hypercholesterolemia, unspecified: Secondary | ICD-10-CM

## 2012-01-24 DIAGNOSIS — F411 Generalized anxiety disorder: Secondary | ICD-10-CM

## 2012-01-24 LAB — COMPREHENSIVE METABOLIC PANEL
ALT: 23 U/L (ref 0–35)
AST: 22 U/L (ref 0–37)
Albumin: 4 g/dL (ref 3.5–5.2)
Alkaline Phosphatase: 68 U/L (ref 39–117)
Calcium: 9 mg/dL (ref 8.4–10.5)
Chloride: 106 mEq/L (ref 96–112)
Potassium: 3.9 mEq/L (ref 3.5–5.1)
Sodium: 138 mEq/L (ref 135–145)

## 2012-01-24 LAB — LDL CHOLESTEROL, DIRECT: Direct LDL: 151.2 mg/dL

## 2012-01-24 LAB — LIPID PANEL: Total CHOL/HDL Ratio: 6

## 2012-01-24 MED ORDER — VENLAFAXINE HCL ER 75 MG PO CP24: 75.0000 mg | ORAL_CAPSULE | Freq: Every day | ORAL | Status: DC

## 2012-01-24 MED ORDER — CITALOPRAM HYDROBROMIDE 20 MG PO TABS: 10.0000 mg | ORAL_TABLET | Freq: Every day | ORAL | Status: DC

## 2012-01-24 NOTE — Progress Notes (Addendum)
  Subjective:    Patient ID: Janet Porter, female    DOB: 25-May-1965, 47 y.o.   MRN: [id number]  HPI  47 year old female presents for chronic health issue maintenance.  Elevated Cholesterol: Due for re-eval. Using medications without problems: no med Diet compliance: Moderate Exercise:none except as below Other complaints: Cannot lose weight. Menopausal symtpoms.  Anxiety, menopausal symptoms: On celexa 20 mg daily. Symtpoms well controlled on this med. She has trieds to stop it but cannot, but she has not tried to wean.  She has tried to loose weight, having trouble.  Moderate diet, cleans houses so stays active.  Review of Systems  Constitutional: Negative for fever and fatigue.  HENT: Negative for ear pain.   Eyes: Negative for pain.  Respiratory: Negative for chest tightness and shortness of breath.   Cardiovascular: Negative for chest pain, palpitations and leg swelling.  Gastrointestinal: Negative for abdominal pain.  Genitourinary: Negative for dysuria.  Psychiatric/Behavioral: Negative for suicidal ideas.       Objective:   Physical Exam  Constitutional: Vital signs are normal. She appears well-developed and well-nourished. She is cooperative.  Non-toxic appearance. She does not appear ill. No distress.  HENT:  Head: Normocephalic.  Right Ear: Hearing, tympanic membrane, external ear and ear canal normal. Tympanic membrane is not erythematous, not retracted and not bulging.  Left Ear: Hearing, tympanic membrane, external ear and ear canal normal. Tympanic membrane is not erythematous, not retracted and not bulging.  Nose: No mucosal edema or rhinorrhea. Right sinus exhibits no maxillary sinus tenderness and no frontal sinus tenderness. Left sinus exhibits no maxillary sinus tenderness and no frontal sinus tenderness.  Mouth/Throat: Uvula is midline, oropharynx is clear and moist and mucous membranes are normal.  Eyes: Conjunctivae normal, EOM and lids are normal.  Pupils are equal, round, and reactive to light. No foreign bodies found.  Neck: Trachea normal and normal range of motion. Neck supple. Carotid bruit is not present. No mass and no thyromegaly present.  Cardiovascular: Normal rate, regular rhythm, S1 normal, S2 normal, normal heart sounds, intact distal pulses and normal pulses.  Exam reveals no gallop and no friction rub.   No murmur heard. Pulmonary/Chest: Effort normal and breath sounds normal. Not tachypneic. No respiratory distress. She has no decreased breath sounds. She has no wheezes. She has no rhonchi. She has no rales.  Abdominal: Soft. Normal appearance and bowel sounds are normal. There is no tenderness.  Neurological: She is alert.  Skin: Skin is warm, dry and intact. No rash noted.  Psychiatric: Her speech is normal and behavior is normal. Judgment and thought content normal. Her mood appears not anxious. Cognition and memory are normal. She does not exhibit a depressed mood.          Assessment & Plan:  .The patient's preventative maintenance and recommended screening tests for an annual wellness exam were reviewed in full today. Brought up to date unless services declined.  Counselled on the importance of diet, exercise, and its role in overall health and mortality. The patient's FH and SH was reviewed, including their home life, tobacco status, and drug and alcohol status.   Mammo, breast, pap at GYN 04/2011 Nonsmoker Vaccines: uptodate, flu given today.  No family colon cancer.

## 2012-01-24 NOTE — Assessment & Plan Note (Signed)
Will change to venlafaxine from celexa as she is concerned about weight and is having poor control of menopausal symtpoms per GYN note.

## 2012-01-24 NOTE — Addendum Note (Signed)
Addended by: Consuello Masse on: 01/24/2012 11:04 AM   Modules accepted: Orders

## 2012-01-24 NOTE — Assessment & Plan Note (Signed)
Due for re-eval. Counsled on exercise and diet changes.

## 2012-01-24 NOTE — Patient Instructions (Addendum)
Start healthy eating habits. Work on low  [employer]. Such as [employer]. Stop all sweetneed beverages. Increase exercise to  150 hours a week of moderate intensity exercise gradually.  Stop celexa and change to effexor daily.   Follow up in 1 month to re-evaluate mood and weight issues.

## 2012-02-02 ENCOUNTER — Encounter: Payer: Self-pay | Admitting: Family Medicine

## 2012-02-24 ENCOUNTER — Ambulatory Visit: Payer: BC Managed Care – PPO | Admitting: Family Medicine

## 2012-03-05 ENCOUNTER — Other Ambulatory Visit: Payer: Self-pay | Admitting: Family Medicine

## 2012-03-05 DIAGNOSIS — Z1231 Encounter for screening mammogram for malignant neoplasm of breast: Secondary | ICD-10-CM

## 2012-04-09 ENCOUNTER — Ambulatory Visit
Admission: RE | Admit: 2012-04-09 | Discharge: 2012-04-09 | Disposition: A | Discharge status: Home / Self Care | Payer: BC Managed Care – PPO | Source: Ambulatory Visit | Attending: Family Medicine | Admitting: Family Medicine

## 2012-04-09 DIAGNOSIS — Z1231 Encounter for screening mammogram for malignant neoplasm of breast: Secondary | ICD-10-CM

## 2012-04-11 ENCOUNTER — Other Ambulatory Visit: Payer: Self-pay | Admitting: Family Medicine

## 2012-04-11 DIAGNOSIS — R928 Other abnormal and inconclusive findings on diagnostic imaging of breast: Secondary | ICD-10-CM

## 2012-04-23 ENCOUNTER — Ambulatory Visit
Admission: RE | Admit: 2012-04-23 | Discharge: 2012-04-23 | Disposition: A | Discharge status: Home / Self Care | Payer: BC Managed Care – PPO | Source: Ambulatory Visit | Attending: Family Medicine | Admitting: Family Medicine

## 2012-04-23 DIAGNOSIS — R928 Other abnormal and inconclusive findings on diagnostic imaging of breast: Secondary | ICD-10-CM

## 2012-05-07 ENCOUNTER — Encounter: Payer: Self-pay | Admitting: Obstetrics & Gynecology

## 2012-05-07 ENCOUNTER — Ambulatory Visit (INDEPENDENT_AMBULATORY_CARE_PROVIDER_SITE_OTHER): Payer: BC Managed Care – PPO | Admitting: Obstetrics & Gynecology

## 2012-05-07 VITALS — BP 145/79 | HR 74 | Resp 16 | Ht 63.0 in | Wt 153.0 lb

## 2012-05-07 DIAGNOSIS — Z124 Encounter for screening for malignant neoplasm of cervix: Secondary | ICD-10-CM

## 2012-05-07 DIAGNOSIS — Z0000 Encounter for general adult medical examination without abnormal findings: Secondary | ICD-10-CM

## 2012-05-07 DIAGNOSIS — Z01419 Encounter for gynecological examination (general) (routine) without abnormal findings: Secondary | ICD-10-CM

## 2012-05-07 DIAGNOSIS — Z1151 Encounter for screening for human papillomavirus (HPV): Secondary | ICD-10-CM

## 2012-05-07 DIAGNOSIS — N920 Excessive and frequent menstruation with regular cycle: Secondary | ICD-10-CM

## 2012-05-07 LAB — CBC
HCT: 38.7 % (ref 36.0–46.0)
MCH: 28.4 pg (ref 26.0–34.0)
MCHC: 33.3 g/dL (ref 30.0–36.0)
MCV: 85.2 fL (ref 78.0–100.0)
Platelets: 231 10*3/uL (ref 150–400)
RDW: 13.5 % (ref 11.5–15.5)

## 2012-05-07 NOTE — Progress Notes (Signed)
Patient ID: Janet Porter, female   DOB: July 06, 1965, 47 y.o.   MRN: [id number] Subjective:    Janet Porter is a 47 y.o. female who presents for an annual exam. She complains of every other month that her period is heavier and more painful than usual. She denies dyspareunia. The patient is sexually active. GYN screening history: last pap: was normal. The patient wears seatbelts: yes. The patient participates in regular exercise: yes (walking QOD) Has the patient ever been transfused or tattooed?: no. The patient reports that there is not domestic violence in her life.   Menstrual History: OB History   Grav Para Term Preterm Abortions TAB SAB Ect Mult Living   3 3        3       Menarche age: 38 Patient's last menstrual period was 04/11/2012.    The following portions of the patient's history were reviewed and updated as appropriate: allergies, current medications, past family history, past medical history, past social history, past surgical history and problem list.  Review of Systems A comprehensive review of systems was negative. She has a cleaning business, married for 19 years. Her kids are ages (17, 37, 20). She was raised in [place], [place].   Objective:    BP 145/79  Pulse 74  Resp 16  Ht 5\' 3"  (1.6 m)  Wt 153 lb (69.4 kg)  BMI 27.11 kg/m2  LMP 04/11/2012  General Appearance:    Alert, cooperative, no distress, appears stated age  Head:    Normocephalic, without obvious abnormality, atraumatic  Eyes:    PERRL, conjunctiva/corneas clear, EOM's intact, fundi    benign, both eyes  Ears:    Normal TM's and external ear canals, both ears  Nose:   Nares normal, septum midline, mucosa normal, no drainage    or sinus tenderness  Throat:   Lips, mucosa, and tongue normal; teeth and gums normal  Neck:   Supple, symmetrical, trachea midline, no adenopathy;    thyroid:  no enlargement/tenderness/nodules; no carotid   bruit or JVD  Back:     Symmetric, no curvature, ROM normal, no CVA  tenderness  Lungs:     Clear to auscultation bilaterally, respirations unlabored  Chest Wall:    No tenderness or deformity   Heart:    Regular rate and rhythm, S1 and S2 normal, no murmur, rub   or gallop  Breast Exam:    No tenderness, masses, or nipple abnormality  Abdomen:     Soft, non-tender, bowel sounds active all four quadrants,    no masses, no organomegaly  Genitalia:    Normal female without lesion, discharge or tenderness, ectropion noted, friable. NSSRV, mobile, NT, normal adnexal exam     Extremities:   Extremities normal, atraumatic, no cyanosis or edema  Pulses:   2+ and symmetric all extremities  Skin:   Skin color, texture, turgor normal, no rashes or lesions  Lymph nodes:   Cervical, supraclavicular, and axillary nodes normal  Neurologic:   CNII-XII intact, normal strength, sensation and reflexes    throughout  .    Assessment:    Healthy female exam.  Menorrhagia, dysmenorrhea   Plan:     Pelvic ultrasound. Thin prep Pap smear.  with HPV cotesting CBC, TSH

## 2012-05-16 ENCOUNTER — Ambulatory Visit (HOSPITAL_COMMUNITY)
Admission: RE | Admit: 2012-05-16 | Discharge: 2012-05-16 | Disposition: A | Discharge status: Home / Self Care | Payer: BC Managed Care – PPO | Source: Ambulatory Visit | Attending: Obstetrics & Gynecology | Admitting: Obstetrics & Gynecology

## 2012-05-16 DIAGNOSIS — D251 Intramural leiomyoma of uterus: Secondary | ICD-10-CM | POA: Insufficient documentation

## 2012-05-16 DIAGNOSIS — N920 Excessive and frequent menstruation with regular cycle: Secondary | ICD-10-CM

## 2012-06-08 ENCOUNTER — Encounter: Payer: Self-pay | Admitting: Obstetrics & Gynecology

## 2012-06-08 ENCOUNTER — Ambulatory Visit (INDEPENDENT_AMBULATORY_CARE_PROVIDER_SITE_OTHER): Payer: BC Managed Care – PPO | Admitting: Obstetrics & Gynecology

## 2012-06-08 VITALS — BP 120/79 | HR 64 | Resp 16 | Ht 63.0 in | Wt 154.0 lb

## 2012-06-08 DIAGNOSIS — N938 Other specified abnormal uterine and vaginal bleeding: Secondary | ICD-10-CM

## 2012-06-08 DIAGNOSIS — N949 Unspecified condition associated with female genital organs and menstrual cycle: Secondary | ICD-10-CM

## 2012-06-08 NOTE — Progress Notes (Signed)
  Subjective:    Patient ID: Janet Porter, female    DOB: 12-11-1965, 47 y.o.   MRN: [id number]  HPI  47 yo lady MW P3 who is here for f/u of w/u for DUB. A uterine polyp versus fibroid was seen on u/s. I have offered her h/s, d&c with or without an endometrial ablation/ and/or BTL.  Review of Systems     Objective:   Physical Exam        Assessment & Plan:  She will consider her options and call when she has made a decision.

## 2012-07-05 ENCOUNTER — Encounter (HOSPITAL_COMMUNITY): Payer: Self-pay | Admitting: *Deleted

## 2012-07-10 ENCOUNTER — Encounter (HOSPITAL_COMMUNITY): Payer: Self-pay | Admitting: Pharmacy Technician

## 2012-07-13 ENCOUNTER — Ambulatory Visit (HOSPITAL_COMMUNITY): Payer: BC Managed Care – PPO | Admitting: Anesthesiology

## 2012-07-13 ENCOUNTER — Encounter (HOSPITAL_COMMUNITY): Payer: Self-pay | Admitting: Anesthesiology

## 2012-07-13 ENCOUNTER — Encounter (HOSPITAL_COMMUNITY)
Admission: RE | Disposition: A | Discharge status: Home / Self Care | Payer: Self-pay | Source: Ambulatory Visit | Attending: Obstetrics & Gynecology

## 2012-07-13 ENCOUNTER — Ambulatory Visit (HOSPITAL_COMMUNITY)
Admission: RE | Admit: 2012-07-13 | Discharge: 2012-07-13 | Disposition: A | Discharge status: Home / Self Care | Payer: BC Managed Care – PPO | Source: Ambulatory Visit | Attending: Obstetrics & Gynecology | Admitting: Obstetrics & Gynecology

## 2012-07-13 DIAGNOSIS — N949 Unspecified condition associated with female genital organs and menstrual cycle: Secondary | ICD-10-CM | POA: Insufficient documentation

## 2012-07-13 DIAGNOSIS — N840 Polyp of corpus uteri: Secondary | ICD-10-CM | POA: Insufficient documentation

## 2012-07-13 DIAGNOSIS — N920 Excessive and frequent menstruation with regular cycle: Secondary | ICD-10-CM | POA: Insufficient documentation

## 2012-07-13 DIAGNOSIS — N938 Other specified abnormal uterine and vaginal bleeding: Secondary | ICD-10-CM | POA: Diagnosis present

## 2012-07-13 HISTORY — PX: DILITATION & CURRETTAGE/HYSTROSCOPY WITH NOVASURE ABLATION: SHX5568

## 2012-07-13 LAB — CBC
HCT: 38.8 % (ref 36.0–46.0)
MCH: 28.8 pg (ref 26.0–34.0)
MCHC: 34.3 g/dL (ref 30.0–36.0)
MCV: 84 fL (ref 78.0–100.0)
Platelets: 222 10*3/uL (ref 150–400)
RDW: 13.2 % (ref 11.5–15.5)
WBC: 6 10*3/uL (ref 4.0–10.5)

## 2012-07-13 SURGERY — Surgical Case
Anesthesia: *Unknown

## 2012-07-13 SURGERY — DILATATION & CURETTAGE/HYSTEROSCOPY WITH NOVASURE ABLATION
Anesthesia: General | Site: Vagina | Wound class: Clean Contaminated

## 2012-07-13 MED ORDER — MIDAZOLAM HCL 2 MG/2ML IJ SOLN
INTRAMUSCULAR | Status: AC
  Filled 2012-07-13: qty 2

## 2012-07-13 MED ORDER — LIDOCAINE HCL (CARDIAC) 20 MG/ML IV SOLN
INTRAVENOUS | Status: AC
  Filled 2012-07-13: qty 5

## 2012-07-13 MED ORDER — BUPIVACAINE HCL 0.5 % IJ SOLN
INTRAMUSCULAR | Status: DC | PRN
  Administered 2012-07-13: 30 mL

## 2012-07-13 MED ORDER — LACTATED RINGERS IR SOLN
Status: DC | PRN
  Administered 2012-07-13: 3000 mL

## 2012-07-13 MED ORDER — FENTANYL CITRATE 0.05 MG/ML IJ SOLN
INTRAMUSCULAR | Status: AC
  Filled 2012-07-13: qty 2

## 2012-07-13 MED ORDER — OXYCODONE-ACETAMINOPHEN 5-325 MG PO TABS: 1.0000 | ORAL_TABLET | ORAL | Status: DC | PRN

## 2012-07-13 MED ORDER — IBUPROFEN 800 MG PO TABS: 800.0000 mg | ORAL_TABLET | Freq: Three times a day (TID) | ORAL | Status: DC | PRN

## 2012-07-13 MED ORDER — CEFAZOLIN SODIUM-DEXTROSE 2-3 GM-% IV SOLR
2.0000 g | Freq: Once | INTRAVENOUS | Status: AC
  Administered 2012-07-13: 2 g via INTRAVENOUS

## 2012-07-13 MED ORDER — LACTATED RINGERS IV SOLN
INTRAVENOUS | Status: DC
  Administered 2012-07-13 (×2): via INTRAVENOUS

## 2012-07-13 MED ORDER — FENTANYL CITRATE 0.05 MG/ML IJ SOLN: 25.0000 ug | INTRAMUSCULAR | Status: DC | PRN

## 2012-07-13 MED ORDER — CEFAZOLIN SODIUM-DEXTROSE 2-3 GM-% IV SOLR
INTRAVENOUS | Status: AC
  Filled 2012-07-13: qty 50

## 2012-07-13 MED ORDER — DEXAMETHASONE SODIUM PHOSPHATE 10 MG/ML IJ SOLN
INTRAMUSCULAR | Status: AC
  Filled 2012-07-13: qty 1

## 2012-07-13 MED ORDER — ONDANSETRON HCL 4 MG/2ML IJ SOLN
INTRAMUSCULAR | Status: AC
  Filled 2012-07-13: qty 2

## 2012-07-13 MED ORDER — PROPOFOL 10 MG/ML IV EMUL
INTRAVENOUS | Status: AC
  Filled 2012-07-13: qty 20

## 2012-07-13 MED ORDER — KETOROLAC TROMETHAMINE 30 MG/ML IJ SOLN
INTRAMUSCULAR | Status: AC
  Filled 2012-07-13: qty 1

## 2012-07-13 MED ORDER — OXYCODONE-ACETAMINOPHEN 5-325 MG PO TABS
ORAL_TABLET | ORAL | Status: AC
  Administered 2012-07-13: 1 via ORAL
  Filled 2012-07-13: qty 1

## 2012-07-13 MED ORDER — LIDOCAINE HCL (CARDIAC) 20 MG/ML IV SOLN
INTRAVENOUS | Status: DC | PRN
  Administered 2012-07-13: 50 mg via INTRAVENOUS

## 2012-07-13 MED ORDER — DEXAMETHASONE SODIUM PHOSPHATE 10 MG/ML IJ SOLN
INTRAMUSCULAR | Status: DC | PRN
  Administered 2012-07-13: 10 mg via INTRAVENOUS

## 2012-07-13 MED ORDER — MIDAZOLAM HCL 5 MG/5ML IJ SOLN
INTRAMUSCULAR | Status: DC | PRN
  Administered 2012-07-13: 2 mg via INTRAVENOUS

## 2012-07-13 MED ORDER — KETOROLAC TROMETHAMINE 30 MG/ML IJ SOLN
INTRAMUSCULAR | Status: DC | PRN
  Administered 2012-07-13: 30 mg via INTRAVENOUS

## 2012-07-13 MED ORDER — PROPOFOL 10 MG/ML IV BOLUS
INTRAVENOUS | Status: DC | PRN
  Administered 2012-07-13: 150 mg via INTRAVENOUS

## 2012-07-13 MED ORDER — FENTANYL CITRATE 0.05 MG/ML IJ SOLN
INTRAMUSCULAR | Status: DC | PRN
  Administered 2012-07-13 (×2): 50 ug via INTRAVENOUS

## 2012-07-13 MED ORDER — BUPIVACAINE HCL (PF) 0.5 % IJ SOLN
INTRAMUSCULAR | Status: AC
  Filled 2012-07-13: qty 30

## 2012-07-13 MED ORDER — ONDANSETRON HCL 4 MG/2ML IJ SOLN
INTRAMUSCULAR | Status: DC | PRN
  Administered 2012-07-13: 4 mg via INTRAVENOUS

## 2012-07-13 SURGICAL SUPPLY — 10 items
ABLATOR ENDOMETRIAL BIPOLAR (ABLATOR) ×2 IMPLANT
CATH ROBINSON RED A/P 16FR (CATHETERS) ×2 IMPLANT
CLOTH BEACON ORANGE TIMEOUT ST (SAFETY) ×2 IMPLANT
DRESSING TELFA 8X3 (GAUZE/BANDAGES/DRESSINGS) ×4 IMPLANT
GLOVE BIO SURGEON STRL SZ 6.5 (GLOVE) ×2 IMPLANT
GOWN STRL REIN XL XLG (GOWN DISPOSABLE) ×6 IMPLANT
PACK HYSTEROSCOPY LF (CUSTOM PROCEDURE TRAY) ×2 IMPLANT
PAD OB MATERNITY 4.3X12.25 (PERSONAL CARE ITEMS) ×2 IMPLANT
TOWEL OR 17X24 6PK STRL BLUE (TOWEL DISPOSABLE) ×4 IMPLANT
WATER STERILE IRR 1000ML POUR (IV SOLUTION) ×2 IMPLANT

## 2012-07-13 NOTE — H&P (Addendum)
Janet Porter is an 47 y.o. female. She is here today for dilation, curettage, hysteroscopy, and endometrial ablation  Pertinent Gynecological History: Menses: flow is excessive with use of 8 pads or tampons on heaviest days Bleeding: menorrhagia Contraception: condoms DES exposure: unknown Blood transfusions: none Sexually transmitted diseases: no past history Previous GYN Procedures: none  Last mammogram: abnormal: 2013 Date: 2013 Last pap: normal Date: 2014 OB History: G3, P3   Menstrual History: Menarche age: 21 No LMP recorded.    Past Medical History  Diagnosis Date  . Headache(784.0)   . Depressive disorder, not elsewhere classified   . Anxiety state, unspecified   . Benign neoplasm of skin, site unspecified   . Unspecified sinusitis (chronic)   . 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 Pure hypercholesterolemia   . Abnormal Pap smear 2005  . Esophageal reflux     pt does not take meds    Past Surgical History  Procedure Laterality Date  . Varicose vein surgery  2000  . Tonsillectomy  1979  . Colposcopy  2000    abn pap  . Tonsillectomy    . Adenoidectomy    . Varicose vein surgery      Family History  Problem Relation Age of Onset  . Hypertension Father   . Diabetes Father   . Hyperlipidemia Father   . Healthy Mother   . Healthy Brother   . Pancreatic cancer Paternal Grandfather   . Cancer Maternal Grandfather     Oral  . Coronary artery disease Maternal Grandmother     Stents    Social History:  reports that she has never smoked. She has never used smokeless tobacco. She reports that she does not drink alcohol or use illicit drugs.  Allergies: No Known Allergies  Prescriptions prior to admission  Medication Sig Dispense Refill  . citalopram (CELEXA) 20 MG tablet Take 20 mg by mouth daily.         Review of Systems  Musculoskeletal:  Negative for falls.    Blood pressure 123/83, pulse 75, temperature 98.1 F (36.7 C), temperature source Oral, resp. rate 18, height 5\' 3"  (1.6 m), weight 65.772 kg (145 lb), SpO2 100.00%. Physical Exam Heart-rrr Lungs- CTAB Abd- benign  Results for orders placed during the hospital encounter of 07/13/12 (from the past 24 hour(s))  PREGNANCY, URINE     Status: None   Collection Time    07/13/12 12:30 PM      Result Value Range   Preg Test, Ur NEGATIVE  NEGATIVE  CBC     Status: None   Collection Time    07/13/12 12:50 PM      Result Value Range   WBC 6.0  4.0 - 10.5 K/uL   RBC 4.62  3.87 - 5.11 MIL/uL   Hemoglobin 13.3  12.0 - 15.0 g/dL   HCT 16.1  09.6 - 04.5 %   MCV 84.0  78.0 - 100.0 fL   MCH 28.8  26.0 - 34.0 pg   MCHC 34.3  30.0 - 36.0 g/dL   RDW 40.9  81.1 - 91.4 %   Platelets 222  150 - 400 K/uL    No results found.  Assessment/Plan: Menorrhagia/DUB- plan for d&c, hysteroscopy, Novasure ablation.  She understands the risks of surgery, including, but not to infection, bleeding, DVTs, damage to bowel, bladder, ureters. She wishes to proceed.    Sophea Rackham C. 07/13/2012, 1:30 PM

## 2012-07-13 NOTE — Transfer of Care (Signed)
Immediate Anesthesia Transfer of Care Note  Patient: Janet Porter  Procedure(s) Performed: Procedure(s): DILATATION & CURETTAGE/HYSTEROSCOPY WITH NOVASURE ABLATION (N/A)  Patient Location: PACU  Anesthesia Type:General  Level of Consciousness: sedated  Airway & Oxygen Therapy: Patient Spontanous Breathing and Patient connected to nasal cannula oxygen  Post-op Assessment: Report given to PACU RN and Post -op Vital signs reviewed and stable  Post vital signs: stable  Complications: No apparent anesthesia complications

## 2012-07-13 NOTE — Op Note (Addendum)
07/13/2012  2:50 PM  PATIENT:  Janet Porter  47 y.o. female  PRE-OPERATIVE DIAGNOSIS:  cpt #98119 -  Dysfunctional uterine bleeding, uterine polyps  POST-OPERATIVE DIAGNOSIS:  dysfunctional uterine bleeding, uterine polyps  PROCEDURE:  Procedure(s): DILATATION & CURETTAGE/HYSTEROSCOPY WITH NOVASURE ABLATION (N/A)  SURGEON:  Surgeon(s) and Role:    * Allie Bossier, MD - Primary  PHYSICIAN ASSISTANT:   ASSISTANTS: none   ANESTHESIA:   general  EBL:  Total I/O In: 1000 [I.V.:1000] Out: 15 [Urine:10; Blood:5]  BLOOD ADMINISTERED:none  DRAINS: none   LOCAL MEDICATIONS USED:  MARCAINE     SPECIMEN:  Source of Specimen:  uterine curettings  DISPOSITION OF SPECIMEN:  PATHOLOGY  COUNTS:  YES  TOURNIQUET:  * No tourniquets in log *  DICTATION: .Dragon Dictation  PLAN OF CARE: Discharge to home after PACU  PATIENT DISPOSITION:  PACU - hemodynamically stable.   Delay start of Pharmacological VTE agent (>24hrs) due to surgical blood loss or risk of bleeding: not applicable           The risks, benefits, and alternatives of surgery were explained, understood, and accepted. All questions were answered. Consents were signed. In the operating room general anesthesia was applied without complication. She was placed in the dorsal lithotomy position and her vagina was prepped and draped in the usual sterile fashion. A Robinson catheter was used to drain her bladder. A bimanual exam revealed a normal size and shape, anteverted mobile uterus. Her adnexa were nonenlarged. A speculum was placed and a single-tooth tenaculum was used to grasp the anterior lip of her nulliparous cervix. A total of 30 mL of 0.5% Marcaine was used to perform a paracervical block. Her uterus sounded to 9 cm. Her cervix was carefully and slowly and easily dilated to accommodate a Novasure device. Hysteroscopy revealed several polyps, no evidence of malignancy. Net fluid loss was about 5 cc.  A curettage was  done in all quadrants and the fundus of the uterus. A small to moderate amount of tissue was obtained. There was no bleeding noted. Her cervical length measured 4.5 cm and the cavity width 2.8 cm. The Novasure device was properly seated. It passed its test. It then ran for 1 min 55 seconds. The tenaculum and the Novasure were removed. No bleeding was noted at the end of the case. She was taken to the recovery room after being extubated. She tolerated the procedure well.

## 2012-07-13 NOTE — Anesthesia Preprocedure Evaluation (Signed)

## 2012-07-16 ENCOUNTER — Encounter (HOSPITAL_COMMUNITY): Payer: Self-pay | Admitting: Obstetrics & Gynecology

## 2012-07-16 NOTE — Anesthesia Postprocedure Evaluation (Signed)
  Anesthesia Post-op Note  Patient: Janet Porter  Procedure(s) Performed: Procedure(s): DILATATION & CURETTAGE/HYSTEROSCOPY WITH NOVASURE ABLATION (N/A) Patient is awake and responsive. Pain and nausea are reasonably well controlled. Vital signs are stable and clinically acceptable. Oxygen saturation is clinically acceptable. There are no apparent anesthetic complications at this time. Patient is ready for discharge.

## 2012-09-13 ENCOUNTER — Other Ambulatory Visit: Payer: Self-pay | Admitting: Family Medicine

## 2012-09-13 NOTE — Telephone Encounter (Signed)
Last Office Visit 01/24/2012.  Listed as Historical Medication prescribed by Dr. Marice Potter.  Ok to refill?

## 2012-09-19 ENCOUNTER — Other Ambulatory Visit: Payer: Self-pay | Admitting: Family Medicine

## 2012-10-29 ENCOUNTER — Other Ambulatory Visit: Payer: Self-pay | Admitting: Obstetrics & Gynecology

## 2012-10-29 DIAGNOSIS — R921 Mammographic calcification found on diagnostic imaging of breast: Secondary | ICD-10-CM

## 2012-10-31 ENCOUNTER — Ambulatory Visit (INDEPENDENT_AMBULATORY_CARE_PROVIDER_SITE_OTHER): Payer: BC Managed Care – PPO

## 2012-10-31 DIAGNOSIS — Z23 Encounter for immunization: Secondary | ICD-10-CM

## 2012-11-12 ENCOUNTER — Ambulatory Visit
Admission: RE | Admit: 2012-11-12 | Discharge: 2012-11-12 | Disposition: A | Discharge status: Home / Self Care | Payer: BC Managed Care – PPO | Source: Ambulatory Visit | Attending: Obstetrics & Gynecology | Admitting: Obstetrics & Gynecology

## 2012-11-12 DIAGNOSIS — R921 Mammographic calcification found on diagnostic imaging of breast: Secondary | ICD-10-CM

## 2013-04-22 ENCOUNTER — Other Ambulatory Visit: Payer: Self-pay | Admitting: Family Medicine

## 2013-04-22 DIAGNOSIS — R921 Mammographic calcification found on diagnostic imaging of breast: Secondary | ICD-10-CM

## 2013-04-30 ENCOUNTER — Ambulatory Visit
Admission: RE | Admit: 2013-04-30 | Discharge: 2013-04-30 | Disposition: A | Discharge status: Home / Self Care | Payer: BC Managed Care – PPO | Source: Ambulatory Visit | Attending: Family Medicine | Admitting: Family Medicine

## 2013-04-30 DIAGNOSIS — R921 Mammographic calcification found on diagnostic imaging of breast: Secondary | ICD-10-CM

## 2013-08-26 ENCOUNTER — Ambulatory Visit: Payer: BC Managed Care – PPO | Admitting: Obstetrics & Gynecology

## 2013-09-07 ENCOUNTER — Other Ambulatory Visit: Payer: Self-pay | Admitting: Family Medicine

## 2013-09-10 ENCOUNTER — Other Ambulatory Visit: Payer: Self-pay | Admitting: Family Medicine

## 2013-09-12 ENCOUNTER — Encounter: Payer: Self-pay | Admitting: Family Medicine

## 2013-09-12 ENCOUNTER — Ambulatory Visit (INDEPENDENT_AMBULATORY_CARE_PROVIDER_SITE_OTHER): Payer: BC Managed Care – PPO | Admitting: Family Medicine

## 2013-09-12 VITALS — BP 100/70 | HR 99 | Temp 98.6°F | Ht 63.0 in | Wt 162.8 lb

## 2013-09-12 DIAGNOSIS — F411 Generalized anxiety disorder: Secondary | ICD-10-CM

## 2013-09-12 DIAGNOSIS — E7800 Pure hypercholesterolemia, unspecified: Secondary | ICD-10-CM

## 2013-09-12 DIAGNOSIS — N951 Menopausal and female climacteric states: Secondary | ICD-10-CM

## 2013-09-12 MED ORDER — CITALOPRAM HYDROBROMIDE 20 MG PO TABS: 20.0000 mg | ORAL_TABLET | Freq: Every day | ORAL | Status: DC

## 2013-09-12 NOTE — Patient Instructions (Addendum)
Have cholesterol and DM screen at GYN. Have them fax [place] a copy.  Work on increasing exercise and healthy eating.

## 2013-09-12 NOTE — Assessment & Plan Note (Signed)
Due for re-eval. 

## 2013-09-12 NOTE — Assessment & Plan Note (Signed)
Stable control on celexa. 

## 2013-09-12 NOTE — Progress Notes (Signed)
48 year old female presents for chronic health issue maintenance.   Elevated Cholesterol: Due for re-eval on no med. Lab Results  Component Value Date   CHOL 241* 01/24/2012   HDL 39.10 01/24/2012   LDLDIRECT 151.2 01/24/2012   TRIG 154.0* 01/24/2012   CHOLHDL 6 01/24/2012  Using medications without problems: no med  Diet compliance: Moderate  Exercise:walking Other complaints: Cannot lose weight. Menopausal symtpoms.   Anxiety, menopausal symptoms: On celexa 20 mg daily. Symtpoms well controlled on this med.  She has trieds to stop it but cannot, but she has not tried to wean.  No insomnia.  She has tried to loose weight, having trouble.  Hss gained 20 lbs in last year. Moderate diet, cleans houses so stays active.  Wt Readings from Last 3 Encounters:  09/12/13 162 lb 12 oz (73.823 kg)  07/10/12 145 lb (65.772 kg)  07/10/12 145 lb (65.772 kg)    Review of Systems  Constitutional: Negative for fever and fatigue.  HENT: Negative for ear pain.  Eyes: Negative for pain.  Respiratory: Negative for chest tightness and shortness of breath.  Cardiovascular: Negative for chest pain, palpitations and leg swelling.  Gastrointestinal: Negative for abdominal pain.  Genitourinary: Negative for dysuria.  Psychiatric/Behavioral: Negative for suicidal ideas.  Objective:   Physical Exam  Constitutional: Vital signs are normal. She appears well-developed and well-nourished. She is cooperative. Non-toxic appearance. She does not appear ill. No distress.  HENT:  Head: Normocephalic.  Right Ear: Hearing, tympanic membrane, external ear and ear canal normal. Tympanic membrane is not erythematous, not retracted and not bulging.  Left Ear: Hearing, tympanic membrane, external ear and ear canal normal. Tympanic membrane is not erythematous, not retracted and not bulging.  Nose: No mucosal edema or rhinorrhea. Right sinus exhibits no maxillary sinus tenderness and no frontal sinus tenderness. Left sinus  exhibits no maxillary sinus tenderness and no frontal sinus tenderness.  Mouth/Throat: Uvula is midline, oropharynx is clear and moist and mucous membranes are normal.  Eyes: Conjunctivae normal, EOM and lids are normal. Pupils are equal, round, and reactive to light. No foreign bodies found.  Neck: Trachea normal and normal range of motion. Neck supple. Carotid bruit is not present. No mass and no thyromegaly present.  Cardiovascular: Normal rate, regular rhythm, S1 normal, S2 normal, normal heart sounds, intact distal pulses and normal pulses. Exam reveals no gallop and no friction rub.  No murmur heard.  Pulmonary/Chest: Effort normal and breath sounds normal. Not tachypneic. No respiratory distress. She has no decreased breath sounds. She has no wheezes. She has no rhonchi. She has no rales.  Abdominal: Soft. Normal appearance and bowel sounds are normal. There is no tenderness.  Neurological: She is alert.  Skin: Skin is warm, dry and intact. No rash noted.  Psychiatric: Her speech is normal and behavior is normal. Judgment and thought content normal. Her mood appears not anxious. Cognition and memory are normal. She does not exhibit a depressed mood.  Assessment & Plan:   .The patient's preventative maintenance and recommended screening tests for an annual wellness exam were reviewed in full today.  Brought up to date unless services declined.  Counselled on the importance of diet, exercise, and its role in overall health and mortality.  The patient's FH and SH was reviewed, including their home life, tobacco status, and drug and alcohol status.   Mammo, breast, pap at GYN has annual  appt on 9/18  Nonsmoker  Vaccines: uptodate Td, flu  Refused. No  family colon cancer.

## 2013-09-12 NOTE — Progress Notes (Signed)
Pre visit review using our clinic review tool, if applicable. No additional management support is needed unless otherwise documented below in the visit note. 

## 2013-09-12 NOTE — Assessment & Plan Note (Signed)
Refilled celexa

## 2013-10-04 ENCOUNTER — Encounter: Payer: Self-pay | Admitting: Obstetrics & Gynecology

## 2013-10-04 ENCOUNTER — Ambulatory Visit (INDEPENDENT_AMBULATORY_CARE_PROVIDER_SITE_OTHER): Payer: BC Managed Care – PPO | Admitting: Obstetrics & Gynecology

## 2013-10-04 VITALS — BP 134/86 | HR 80 | Ht 63.0 in | Wt 162.0 lb

## 2013-10-04 DIAGNOSIS — Z01419 Encounter for gynecological examination (general) (routine) without abnormal findings: Secondary | ICD-10-CM | POA: Diagnosis not present

## 2013-10-04 DIAGNOSIS — Z1151 Encounter for screening for human papillomavirus (HPV): Secondary | ICD-10-CM

## 2013-10-04 DIAGNOSIS — Z124 Encounter for screening for malignant neoplasm of cervix: Secondary | ICD-10-CM

## 2013-10-04 DIAGNOSIS — Z23 Encounter for immunization: Secondary | ICD-10-CM

## 2013-10-04 DIAGNOSIS — Z0000 Encounter for general adult medical examination without abnormal findings: Secondary | ICD-10-CM

## 2013-10-04 NOTE — Progress Notes (Signed)
Subjective:    Janet Porter is a 48 y.o. MW P3 (60, 81, 66 yo kids) female who presents for an annual exam. The patient has no complaints today. The patient is sexually active. GYN screening history: last pap: was normal. The patient wears seatbelts: yes. The patient participates in regular exercise: yes. Has the patient ever been transfused or tattooed?: no. The patient reports that there is not domestic violence in her life.   Menstrual History: OB History   Grav Para Term Preterm Abortions TAB SAB Ect Mult Living   3 3        3       Menarche age: 9  No LMP recorded. Patient is not currently having periods (Reason: Other).    The following portions of the patient's history were reviewed and updated as appropriate: allergies, current medications, past family history, past medical history, past social history, past surgical history and problem list.  Review of Systems A comprehensive review of systems was negative. 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 Married for 21 years, denies dyspareunia. Mammogram UTD. Wants labs today as well as flu vaccine. She does not have regular periods, just a little spotting the last 2 months (s/p Novasure 2014)   Objective:    BP 134/86  Pulse 80  Ht 5\' 3"  (1.6 m)  Wt 162 lb (73.483 kg)  BMI 28.70 kg/m2  General Appearance:    Alert, cooperative, no distress, appears stated age  Head:    Normocephalic, without obvious abnormality, atraumatic  Eyes:    PERRL, conjunctiva/corneas clear, EOM's intact, fundi    benign, both eyes  Ears:    Normal TM's and external ear canals, both ears  Nose:   Nares normal, septum midline, mucosa normal, no drainage    or sinus tenderness  Throat:   Lips, mucosa, and tongue normal; teeth and gums normal  Neck:   Supple, symmetrical, trachea midline, no adenopathy;    thyroid:  no enlargement/tenderness/nodules; no carotid   bruit or JVD  Back:     Symmetric, no curvature, ROM normal, no CVA tenderness   Lungs:     Clear to auscultation bilaterally, respirations unlabored  Chest Wall:    No tenderness or deformity   Heart:    Regular rate and rhythm, S1 and S2 normal, no murmur, rub   or gallop  Breast Exam:    No tenderness, masses, or nipple abnormality  Abdomen:     Soft, non-tender, bowel sounds active all four quadrants,    no masses, no organomegaly  Genitalia:    Normal female without lesion, discharge or tenderness, NSSRV, NT, mobile, normal adnexal exam     Extremities:   Extremities normal, atraumatic, no cyanosis or edema  Pulses:   2+ and symmetric all extremities  Skin:   Skin color, texture, turgor normal, no rashes or lesions  Lymph nodes:   Cervical, supraclavicular, and axillary nodes normal  Neurologic:   CNII-XII intact, normal strength, sensation and reflexes    throughout  .    Assessment:    Healthy female exam.    Plan:     Breast self exam technique reviewed and patient encouraged to perform self-exam monthly. Thin prep Pap smear. with cotesting Fasting labs

## 2013-10-04 NOTE — Addendum Note (Signed)
Addended by: Lin Landsman C on: 10/04/2013 10:19 AM   Modules accepted: Orders

## 2013-10-05 LAB — CBC
HCT: 44 % (ref 36.0–46.0)
HEMOGLOBIN: 14.6 g/dL (ref 12.0–15.0)
MCH: 30.7 pg (ref 26.0–34.0)
MCHC: 33.2 g/dL (ref 30.0–36.0)
MCV: 92.6 fL (ref 78.0–100.0)
Platelets: 258 10*3/uL (ref 150–400)
RBC: 4.75 MIL/uL (ref 3.87–5.11)
RDW: 13.4 % (ref 11.5–15.5)
WBC: 5.4 10*3/uL (ref 4.0–10.5)

## 2013-10-05 LAB — COMPREHENSIVE METABOLIC PANEL
ALT: 72 U/L — ABNORMAL HIGH (ref 0–35)
AST: 47 U/L — ABNORMAL HIGH (ref 0–37)
Albumin: 4.5 g/dL (ref 3.5–5.2)
Alkaline Phosphatase: 80 U/L (ref 39–117)
BUN: 15 mg/dL (ref 6–23)
CALCIUM: 9.2 mg/dL (ref 8.4–10.5)
CHLORIDE: 104 meq/L (ref 96–112)
CO2: 23 meq/L (ref 19–32)
CREATININE: 0.72 mg/dL (ref 0.50–1.10)
GLUCOSE: 100 mg/dL — AB (ref 70–99)
Potassium: 4.3 mEq/L (ref 3.5–5.3)
Sodium: 140 mEq/L (ref 135–145)
Total Bilirubin: 0.8 mg/dL (ref 0.2–1.2)
Total Protein: 6.9 g/dL (ref 6.0–8.3)

## 2013-10-05 LAB — LIPID PANEL
CHOL/HDL RATIO: 6.4 ratio
CHOLESTEROL: 256 mg/dL — AB (ref 0–200)
HDL: 40 mg/dL (ref >39)
LDL Cholesterol: 182 mg/dL — ABNORMAL HIGH (ref 0–99)
Triglycerides: 172 mg/dL — ABNORMAL HIGH (ref <150)
VLDL: 34 mg/dL (ref 0–40)

## 2013-10-05 LAB — TSH: TSH: 1.957 u[IU]/mL (ref 0.350–4.500)

## 2013-10-07 LAB — CYTOLOGY - PAP

## 2013-10-18 ENCOUNTER — Encounter: Payer: Self-pay | Admitting: Family Medicine

## 2013-10-18 ENCOUNTER — Ambulatory Visit (INDEPENDENT_AMBULATORY_CARE_PROVIDER_SITE_OTHER): Payer: BC Managed Care – PPO | Admitting: Family Medicine

## 2013-10-18 VITALS — BP 120/80 | HR 72 | Temp 98.4°F | Ht 63.0 in | Wt 163.5 lb

## 2013-10-18 DIAGNOSIS — R7303 Prediabetes: Secondary | ICD-10-CM | POA: Insufficient documentation

## 2013-10-18 DIAGNOSIS — R945 Abnormal results of liver function studies: Secondary | ICD-10-CM

## 2013-10-18 DIAGNOSIS — R7309 Other abnormal glucose: Secondary | ICD-10-CM

## 2013-10-18 DIAGNOSIS — R7989 Other specified abnormal findings of blood chemistry: Secondary | ICD-10-CM

## 2013-10-18 DIAGNOSIS — E7800 Pure hypercholesterolemia, unspecified: Secondary | ICD-10-CM

## 2013-10-18 DIAGNOSIS — K760 Fatty (change of) liver, not elsewhere classified: Secondary | ICD-10-CM | POA: Insufficient documentation

## 2013-10-18 LAB — HEPATIC FUNCTION PANEL
ALBUMIN: 4.4 g/dL (ref 3.5–5.2)
ALK PHOS: 70 U/L (ref 39–117)
ALT: 74 U/L — ABNORMAL HIGH (ref 0–35)
AST: 49 U/L — AB (ref 0–37)
BILIRUBIN DIRECT: 0 mg/dL (ref 0.0–0.3)
TOTAL PROTEIN: 7.8 g/dL (ref 6.0–8.3)
Total Bilirubin: 1 mg/dL (ref 0.2–1.2)

## 2013-10-18 NOTE — Assessment & Plan Note (Signed)
Low cholesterol diet

## 2013-10-18 NOTE — Progress Notes (Signed)
   Subjective:    Patient ID: Janet Porter, female    DOB: [birth year], 48 y.o.   MRN: [id number]  HPI  48 year old female presents to discuss labs done  By Dr. Hulan Fray. Recent labs show cholesterol and blood sugar. above goal and liver funciton test elevated.  LDL above goal < 130.  Never been on any med. Lab Results  Component Value Date   CHOL 256* 10/04/2013   HDL 40 10/04/2013   LDLCALC 182* 10/04/2013   LDLDIRECT 151.2 01/24/2012   TRIG 172* 10/04/2013   CHOLHDL 6.4 10/04/2013  Diet: poor diet Eats a lot of bread and past. No meats. Eats a lot of cheese. She doesn't know this is going to be able to change. Exercise; occ walking but started new job so has not lately.  BP Readings from Last 3 Encounters:  10/18/13 120/80  10/04/13 134/86  09/12/13 100/70   Elevated liver function test; No ETOH use, no recent tylenol  Review of Systems  Constitutional: Negative for fever and fatigue.  HENT: Negative for ear pain.   Eyes: Negative for pain.  Respiratory: Negative for shortness of breath.   Cardiovascular: Negative for chest pain and leg swelling.       Objective:   Physical Exam  Constitutional: Vital signs are normal. She appears well-developed and well-nourished. She is cooperative.  Non-toxic appearance. She does not appear ill. No distress.  HENT:  Head: Normocephalic.  Right Ear: Hearing, tympanic membrane, external ear and ear canal normal. Tympanic membrane is not erythematous, not retracted and not bulging.  Left Ear: Hearing, tympanic membrane, external ear and ear canal normal. Tympanic membrane is not erythematous, not retracted and not bulging.  Nose: No mucosal edema or rhinorrhea. Right sinus exhibits no maxillary sinus tenderness and no frontal sinus tenderness. Left sinus exhibits no maxillary sinus tenderness and no frontal sinus tenderness.  Mouth/Throat: Uvula is midline, oropharynx is clear and moist and mucous membranes are normal.  Eyes: Conjunctivae, EOM  and lids are normal. Pupils are equal, round, and reactive to light. Lids are everted and swept, no foreign bodies found.  Neck: Trachea normal and normal range of motion. Neck supple. Carotid bruit is not present. No mass and no thyromegaly present.  Cardiovascular: Normal rate, regular rhythm, S1 normal, S2 normal, normal heart sounds, intact distal pulses and normal pulses.  Exam reveals no gallop and no friction rub.   No murmur heard. Pulmonary/Chest: Effort normal and breath sounds normal. Not tachypneic. No respiratory distress. She has no decreased breath sounds. She has no wheezes. She has no rhonchi. She has no rales.  Abdominal: Soft. Normal appearance and bowel sounds are normal. There is no tenderness.  Neurological: She is alert.  Skin: Skin is warm, dry and intact. No rash noted.  Psychiatric: Her speech is normal and behavior is normal. Judgment and thought content normal. Her mood appears not anxious. Cognition and memory are normal. She does not exhibit a depressed mood.          Assessment & Plan:

## 2013-10-18 NOTE — Patient Instructions (Signed)
Work on weight loss, low cholesterol/fat, low carb diet, increase exercise. Consider [place] as an option. Avoid tylenol or alcohol. Stop at lab on way out.

## 2013-10-18 NOTE — Assessment & Plan Note (Signed)
Encouraged exercise, weight loss, healthy eating habits. Info given.

## 2013-10-18 NOTE — Progress Notes (Signed)
Pre visit review using our clinic review tool, if applicable. No additional management support is needed unless otherwise documented below in the visit note. 

## 2013-10-18 NOTE — Assessment & Plan Note (Signed)
No family histroy no ETOH or tyelnol use.   Will re-eval and check hepatitis panel. If persistently elevated eval with [place].

## 2013-10-19 LAB — HEPATITIS PANEL, ACUTE
HCV Ab: NEGATIVE
Hep A IgM: NONREACTIVE
Hep B C IgM: NONREACTIVE
Hepatitis B Surface Ag: NEGATIVE

## 2013-10-21 ENCOUNTER — Telehealth: Payer: Self-pay | Admitting: Family Medicine

## 2013-10-21 DIAGNOSIS — R945 Abnormal results of liver function studies: Principal | ICD-10-CM

## 2013-10-21 DIAGNOSIS — R7989 Other specified abnormal findings of blood chemistry: Secondary | ICD-10-CM

## 2013-10-21 NOTE — Telephone Encounter (Signed)
Message copied by Jinny Sanders on Mon Oct 21, 2013 11:00 PM ------      Message from: Carter Kitten      Created: Mon Oct 21, 2013 11:40 AM       Janet Porter notified as instructed by telephone.  She is agreeable to getting the ultrasound.      Request a Monday appointment or a Wednesday morning appointment. ------

## 2013-10-30 ENCOUNTER — Telehealth: Payer: Self-pay | Admitting: *Deleted

## 2013-10-30 ENCOUNTER — Ambulatory Visit
Admission: RE | Admit: 2013-10-30 | Discharge: 2013-10-30 | Disposition: A | Discharge status: Home / Self Care | Payer: BC Managed Care – PPO | Source: Ambulatory Visit | Attending: Family Medicine | Admitting: Family Medicine

## 2013-10-30 DIAGNOSIS — R7989 Other specified abnormal findings of blood chemistry: Secondary | ICD-10-CM

## 2013-10-30 DIAGNOSIS — R945 Abnormal results of liver function studies: Principal | ICD-10-CM

## 2013-10-30 NOTE — Telephone Encounter (Addendum)
Message copied by Erik Obey on Wed Oct 30, 2013 11:21 AM ------      Message from: Emily Filbert      Created: Fri Oct 18, 2013 10:55 AM       She needs to see her primary care about her lipids.      Thanks ------Patient has already seen her primary care physician regarding her test results.

## 2013-11-01 ENCOUNTER — Other Ambulatory Visit: Payer: Self-pay | Admitting: Family Medicine

## 2013-11-01 DIAGNOSIS — K769 Liver disease, unspecified: Secondary | ICD-10-CM

## 2013-11-10 ENCOUNTER — Ambulatory Visit
Admission: RE | Admit: 2013-11-10 | Discharge: 2013-11-10 | Disposition: A | Discharge status: Home / Self Care | Payer: BC Managed Care – PPO | Source: Ambulatory Visit | Attending: Family Medicine | Admitting: Family Medicine

## 2013-11-10 DIAGNOSIS — K769 Liver disease, unspecified: Secondary | ICD-10-CM

## 2013-11-10 MED ORDER — GADOXETATE DISODIUM 0.25 MMOL/ML IV SOLN
7.0000 mL | Freq: Once | INTRAVENOUS | Status: AC | PRN
  Administered 2013-11-10: 7 mL via INTRAVENOUS

## 2013-11-12 ENCOUNTER — Telehealth: Payer: Self-pay | Admitting: Family Medicine

## 2013-11-12 DIAGNOSIS — E7800 Pure hypercholesterolemia, unspecified: Secondary | ICD-10-CM

## 2013-11-12 NOTE — Telephone Encounter (Signed)
Janet Porter notified as instructed by telephone.Marland Kitchen

## 2013-11-12 NOTE — Telephone Encounter (Signed)
Yes. Lets have her start simvastatin 20 mg daily. Return in 3 months for CMEt and lipids.  Rx sent in.

## 2013-11-12 NOTE — Telephone Encounter (Signed)
Message copied by Jinny Sanders on Tue Nov 12, 2013  1:21 PM ------      Message from: Carter Kitten      Created: Tue Nov 12, 2013  8:50 AM       Shyteria notified as instructed by telephone.  She states Dr. Diona Browner was holding off on starting a cholesterol medication until his result comes back.  She is wondering if she should start Lipitor at this point.  Please advise. ------

## 2013-11-14 MED ORDER — SIMVASTATIN 20 MG PO TABS
20.0000 mg | ORAL_TABLET | Freq: Every day | ORAL | Status: DC
Start: 2013-11-14 — End: 2014-04-16

## 2013-11-14 NOTE — Telephone Encounter (Signed)
Pt left v/m; that new med was not at [facility]; Do not see on med list as being sent in.Please advise.

## 2013-11-14 NOTE — Addendum Note (Signed)
Addended by: Carter Kitten on: 11/14/2013 12:50 PM   Modules accepted: Orders

## 2013-11-14 NOTE — Telephone Encounter (Signed)
Simvastatin 20 mg prescription sent to [facility].  Shyasia notified.

## 2013-11-16 ENCOUNTER — Emergency Department (HOSPITAL_COMMUNITY)
Admission: EM | Admit: 2013-11-16 | Discharge: 2013-11-16 | Disposition: A | Discharge status: Home / Self Care | Payer: BC Managed Care – PPO | Source: Home / Self Care | Attending: Family Medicine | Admitting: Family Medicine

## 2013-11-16 ENCOUNTER — Encounter (HOSPITAL_COMMUNITY): Payer: Self-pay | Admitting: Emergency Medicine

## 2013-11-16 DIAGNOSIS — M65262 Calcific tendinitis, left lower leg: Secondary | ICD-10-CM

## 2013-11-16 MED ORDER — MELOXICAM 7.5 MG PO TABS: 7.5000 mg | ORAL_TABLET | Freq: Two times a day (BID) | ORAL | Status: DC

## 2013-11-16 NOTE — ED Provider Notes (Signed)
CSN: [id number]     Arrival date & time 11/16/13  8938 History   First MD Initiated Contact with Patient 11/16/13 1024     Chief Complaint  Patient presents with  . Leg Pain   (Consider location/radiation/quality/duration/timing/severity/associated sxs/prior Treatment) Patient is a 48 y.o. female presenting with leg pain. The history is provided by the patient.  Leg Pain Location:  Leg Time since incident:  2 weeks Injury: no   Leg location:  L lower leg Pain details:    Quality:  Burning   Radiates to:  Does not radiate   Severity:  Mild   Onset quality:  Gradual Chronicity:  New Dislocation: no   Prior injury to area:  No Ineffective treatments:  None tried Associated symptoms: no back pain, no decreased ROM, no fever, no muscle weakness, no numbness, no swelling and no tingling     Past Medical History  Diagnosis Date  . Headache(784.0)   . Depressive disorder, not elsewhere classified   . Anxiety state, unspecified   . Benign neoplasm of skin, site unspecified   . Unspecified sinusitis (chronic)   . 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 Pure hypercholesterolemia   . Abnormal Pap smear 2005  . Esophageal reflux     pt does not take meds   Past Surgical History  Procedure Laterality Date  . Varicose vein surgery  2000  . Tonsillectomy  1979  . Colposcopy  2000    abn pap  . Tonsillectomy    . Adenoidectomy    . Varicose vein surgery    . Dilitation & currettage/hystroscopy with novasure ablation N/A 07/13/2012    Procedure: DILATATION & CURETTAGE/HYSTEROSCOPY WITH NOVASURE ABLATION;  Surgeon: Emily Filbert, MD;  Location: [facility];  Service: Gynecology;  Laterality: N/A;   Family History  Problem Relation Age of Onset  . Hypertension Father   . Diabetes Father   . Hyperlipidemia Father   . Healthy Mother   . Healthy Brother   . Pancreatic cancer Paternal  Grandfather   . Cancer Maternal Grandfather     Oral  . Coronary artery disease Maternal Grandmother     Stents   History  Substance Use Topics  . Smoking status: Never Smoker   . Smokeless tobacco: Never Used  . Alcohol Use: No   OB History   Grav Para Term Preterm Abortions TAB SAB Ect Mult Living   3 3        3      Review of Systems  Constitutional: Negative.  Negative for fever.  Cardiovascular: Negative for leg swelling.  Musculoskeletal: Negative for back pain, gait problem and joint swelling.  Skin: Negative.     Allergies  Review of patient's allergies indicates no known allergies.  Home Medications   Prior to Admission medications   Medication Sig Start Date End Date Taking? Authorizing Provider  citalopram (CELEXA) 20 MG tablet Take 1 tablet (20 mg total) by mouth daily. 09/12/13  Yes Amy Cletis Athens, MD  simvastatin (ZOCOR) 20 MG tablet Take 1 tablet (20 mg total) by mouth at bedtime. 11/14/13  Yes [name] [name], MD   BP 141/90  Pulse 87  Temp(Src) 98.2 F (36.8 C) (Oral)  Resp 20  SpO2 96% Physical Exam  Nursing note and vitals reviewed. Constitutional: She is oriented to person, place, and time. She appears well-developed and well-nourished.  Musculoskeletal: She exhibits tenderness.  Left lower leg: She exhibits tenderness. She exhibits no swelling, no edema and no deformity.       Legs: Neurological: She is alert and oriented to person, place, and time.  Skin: Skin is warm and dry.    ED Course  Procedures (including critical care time) Labs Review Labs Reviewed - No data to display  Imaging Review No results found.   MDM   1. Calcific tendonitis of lower leg, left        Billy Fischer, MD 11/16/13 1042

## 2013-11-16 NOTE — ED Notes (Signed)
C/O gradual onset left lateral lower leg pain ("burning" at present) x approx 2 wks with progressive worsening.  Pt cleans houses for a living; pain worse at night; aggravated by going up/down stairs, flexing/extending foot.  No warmth or redness noted.

## 2013-11-16 NOTE — Discharge Instructions (Signed)
Ice, stretch, change shoes, support hose, use medicine as needed, see your doctor if further problems.

## 2013-11-18 ENCOUNTER — Encounter (HOSPITAL_COMMUNITY): Payer: Self-pay | Admitting: Emergency Medicine

## 2013-12-03 ENCOUNTER — Ambulatory Visit (INDEPENDENT_AMBULATORY_CARE_PROVIDER_SITE_OTHER): Payer: BC Managed Care – PPO | Admitting: Family Medicine

## 2013-12-03 ENCOUNTER — Encounter: Payer: Self-pay | Admitting: Family Medicine

## 2013-12-03 VITALS — BP 110/74 | HR 65 | Temp 98.2°F | Ht 63.0 in | Wt 162.8 lb

## 2013-12-03 DIAGNOSIS — M65262 Calcific tendinitis, left lower leg: Secondary | ICD-10-CM | POA: Insufficient documentation

## 2013-12-03 NOTE — Patient Instructions (Signed)
Home physical therapy 2-3 times a day Take 7.5 mg meloxicam with food daily for the next 4-5 days. Can use prilosec 20 mg to protect stomach. Hold cholesterol for 1 week to see if causing some muscle pain. If not improving in 2-3 weeks.. Make appt with Dr. Lorelei Pont.  Hold off on exercise that triggers pain.

## 2013-12-03 NOTE — Progress Notes (Signed)
   Subjective:    Patient ID: Janet Porter, female    DOB: 10-Jan-1966, 48 y.o.   MRN: [id number]  HPI    48 year old female presents with calcifiuc  tendonitis  Of left lower leg.  She was seen at an urgent care on 10/31. Noted reviewed in detail. She was treated with ice,streching, rec hanging shoes.  She was given meloxicam, this helps with severe pain, but causes her stomach to hurt.  Only occ icing, occ stretching.   Her leg pain has continued intermittantly , now ongoing x 6  Weeks. PAin is in anterior ankle and extends up to anterior shin. No known injury, fall. Pain had followed her wearing heels on feet more. Pain is greatest going up  and down teps, also pain with flexion and extension. Her symptoms improve, but come back with walking the following day.   Review of Systems  Constitutional: Negative for fever and fatigue.  HENT: Negative for ear pain.   Eyes: Negative for pain.  Respiratory: Negative for chest tightness and shortness of breath.   Cardiovascular: Negative for chest pain, palpitations and leg swelling.  Gastrointestinal: Negative for abdominal pain.  Genitourinary: Negative for dysuria.       Objective:   Physical Exam  Constitutional: Vital signs are normal. She appears well-developed and well-nourished. She is cooperative.  Non-toxic appearance. She does not appear ill. No distress.  HENT:  Head: Normocephalic.  Right Ear: Hearing, tympanic membrane, external ear and ear canal normal. Tympanic membrane is not erythematous, not retracted and not bulging.  Left Ear: Hearing, tympanic membrane, external ear and ear canal normal. Tympanic membrane is not erythematous, not retracted and not bulging.  Nose: No mucosal edema or rhinorrhea. Right sinus exhibits no maxillary sinus tenderness and no frontal sinus tenderness. Left sinus exhibits no maxillary sinus tenderness and no frontal sinus tenderness.  Mouth/Throat: Uvula is midline, oropharynx is clear  and moist and mucous membranes are normal.  Eyes: Conjunctivae, EOM and lids are normal. Pupils are equal, round, and reactive to light. Lids are everted and swept, no foreign bodies found.  Neck: Trachea normal and normal range of motion. Neck supple. Carotid bruit is not present. No thyroid mass and no thyromegaly present.  Cardiovascular: Normal rate, regular rhythm, S1 normal, S2 normal, normal heart sounds, intact distal pulses and normal pulses.  Exam reveals no gallop and no friction rub.   No murmur heard. Pulmonary/Chest: Effort normal and breath sounds normal. No tachypnea. No respiratory distress. She has no decreased breath sounds. She has no wheezes. She has no rhonchi. She has no rales.  Abdominal: Soft. Normal appearance and bowel sounds are normal. There is no tenderness.  Musculoskeletal:       Left ankle: She exhibits normal range of motion and no swelling. Tenderness. No lateral malleolus, no medial malleolus, no AITFL, no CF ligament, no posterior TFL, no head of 5th metatarsal and no proximal fibula tenderness found.       Left lower leg: She exhibits tenderness. She exhibits no bony tenderness.  ttp over left anterior lower leg and anterior ankle, above talar plateau.  Neurological: She is alert.  Skin: Skin is warm, dry and intact. No rash noted.  Psychiatric: Her speech is normal and behavior is normal. Judgment and thought content normal. Her mood appears not anxious. Cognition and memory are normal. She does not exhibit a depressed mood.          Assessment & Plan:

## 2013-12-03 NOTE — Assessment & Plan Note (Signed)
Treat with NSAODs ( can take with food and PPI to protect stomach) Add more consistnet home PT ( offered referral but pt refused). Info packet with exercises provided.  if not improving follow up for sports med consult.

## 2013-12-03 NOTE — Progress Notes (Signed)
Pre visit review using our clinic review tool, if applicable. No additional management support is needed unless otherwise documented below in the visit note. 

## 2014-04-16 ENCOUNTER — Other Ambulatory Visit: Payer: Self-pay | Admitting: Family Medicine

## 2014-04-21 ENCOUNTER — Other Ambulatory Visit: Payer: Self-pay | Admitting: Obstetrics & Gynecology

## 2014-04-21 DIAGNOSIS — R921 Mammographic calcification found on diagnostic imaging of breast: Secondary | ICD-10-CM

## 2014-04-24 ENCOUNTER — Other Ambulatory Visit: Payer: Self-pay | Admitting: Family Medicine

## 2014-05-07 ENCOUNTER — Ambulatory Visit
Admission: RE | Admit: 2014-05-07 | Discharge: 2014-05-07 | Disposition: A | Discharge status: Home / Self Care | Payer: BLUE CROSS/BLUE SHIELD | Source: Ambulatory Visit | Attending: Obstetrics & Gynecology | Admitting: Obstetrics & Gynecology

## 2014-05-07 DIAGNOSIS — R921 Mammographic calcification found on diagnostic imaging of breast: Secondary | ICD-10-CM

## 2014-09-04 ENCOUNTER — Other Ambulatory Visit: Payer: Self-pay | Admitting: Family Medicine

## 2014-11-04 ENCOUNTER — Encounter: Payer: Self-pay | Admitting: Obstetrics & Gynecology

## 2014-11-04 ENCOUNTER — Ambulatory Visit (INDEPENDENT_AMBULATORY_CARE_PROVIDER_SITE_OTHER): Payer: BLUE CROSS/BLUE SHIELD | Admitting: Obstetrics & Gynecology

## 2014-11-04 VITALS — BP 139/81 | HR 73 | Resp 20 | Ht 63.0 in | Wt 164.2 lb

## 2014-11-04 DIAGNOSIS — Z01419 Encounter for gynecological examination (general) (routine) without abnormal findings: Secondary | ICD-10-CM

## 2014-11-04 DIAGNOSIS — Z1151 Encounter for screening for human papillomavirus (HPV): Secondary | ICD-10-CM

## 2014-11-04 DIAGNOSIS — Z0000 Encounter for general adult medical examination without abnormal findings: Secondary | ICD-10-CM

## 2014-11-04 DIAGNOSIS — Z23 Encounter for immunization: Secondary | ICD-10-CM | POA: Diagnosis not present

## 2014-11-04 DIAGNOSIS — Z124 Encounter for screening for malignant neoplasm of cervix: Secondary | ICD-10-CM

## 2014-11-04 LAB — CBC
HEMATOCRIT: 41.8 % (ref 36.0–46.0)
HEMOGLOBIN: 13.8 g/dL (ref 12.0–15.0)
MCH: 30.7 pg (ref 26.0–34.0)
MCHC: 33 g/dL (ref 30.0–36.0)
MCV: 92.9 fL (ref 78.0–100.0)
MPV: 11.1 fL (ref 8.6–12.4)
Platelets: 218 10*3/uL (ref 150–400)
RBC: 4.5 MIL/uL (ref 3.87–5.11)
RDW: 12.9 % (ref 11.5–15.5)
WBC: 5.5 10*3/uL (ref 4.0–10.5)

## 2014-11-04 LAB — COMPREHENSIVE METABOLIC PANEL
ALT: 41 U/L — ABNORMAL HIGH (ref 6–29)
AST: 31 U/L (ref 10–35)
Albumin: 4.2 g/dL (ref 3.6–5.1)
Alkaline Phosphatase: 74 U/L (ref 33–115)
BUN: 11 mg/dL (ref 7–25)
CALCIUM: 8.9 mg/dL (ref 8.6–10.2)
CO2: 26 mmol/L (ref 20–31)
Chloride: 106 mmol/L (ref 98–110)
Creat: 0.63 mg/dL (ref 0.50–1.10)
GLUCOSE: 103 mg/dL — AB (ref 65–99)
Potassium: 3.9 mmol/L (ref 3.5–5.3)
Sodium: 140 mmol/L (ref 135–146)
TOTAL PROTEIN: 6.3 g/dL (ref 6.1–8.1)
Total Bilirubin: 0.9 mg/dL (ref 0.2–1.2)

## 2014-11-04 LAB — LIPID PANEL
CHOL/HDL RATIO: 5.1 ratio — AB (ref <=5.0)
Cholesterol: 169 mg/dL (ref 125–200)
HDL: 33 mg/dL — AB (ref >=46)
LDL CALC: 97 mg/dL (ref <130)
Triglycerides: 196 mg/dL — ABNORMAL HIGH (ref <150)
VLDL: 39 mg/dL — AB (ref <30)

## 2014-11-04 LAB — TSH: TSH: 1.97 u[IU]/mL (ref 0.350–4.500)

## 2014-11-04 NOTE — Progress Notes (Signed)
Subjective:    Janet Porter is a 49 y.o. MW P3 (49, 52, and 53 yo kids)  female who presents for an annual exam. The patient has no complaints today. The patient is sexually active. GYN screening history: last pap: was normal. The patient wears seatbelts: yes. The patient participates in regular exercise: yes. Has the patient ever been transfused or tattooed?: no. The patient reports that there is not domestic violence in her life.   Menstrual History: OB History    Gravida Para Term Preterm AB TAB SAB Ectopic Multiple Living   3 3        3       Menarche age: 49  No LMP recorded. Patient has had an ablation.    The following portions of the patient's history were reviewed and updated as appropriate: allergies, current medications, past family history, past medical history, past social history, past surgical history and problem list.  Review of Systems Pertinent items noted in HPI and remainder of comprehensive ROS otherwise negative.  Has a cleaning business. Married for 22 years. Denies dyspareunia. Some hot flashes. Periods little after ablations (only wears a liner). Wants a flu vaccine. Mammogram UTD.   Objective:    There were no vitals taken for this visit.  General Appearance:    Alert, cooperative, no distress, appears stated age  Head:    Normocephalic, without obvious abnormality, atraumatic  Eyes:    PERRL, conjunctiva/corneas clear, EOM's intact, fundi    benign, both eyes  Ears:    Normal TM's and external ear canals, both ears  Nose:   Nares normal, septum midline, mucosa normal, no drainage    or sinus tenderness  Throat:   Lips, mucosa, and tongue normal; teeth and gums normal  Neck:   Supple, symmetrical, trachea midline, no adenopathy;    thyroid:  no enlargement/tenderness/nodules; no carotid   bruit or JVD  Back:     Symmetric, no curvature, ROM normal, no CVA tenderness  Lungs:     Clear to auscultation bilaterally, respirations unlabored  Chest Wall:    No  tenderness or deformity   Heart:    Regular rate and rhythm, S1 and S2 normal, no murmur, rub   or gallop  Breast Exam:    No tenderness, masses, or nipple abnormality  Abdomen:     Soft, non-tender, bowel sounds active all four quadrants,    no masses, no organomegaly  Genitalia:    Normal female without lesion, discharge or tenderness, NSSA, NT, mobile, normal adnexal exam     Extremities:   Extremities normal, atraumatic, no cyanosis or edema  Pulses:   2+ and symmetric all extremities  Skin:   Skin color, texture, turgor normal, no rashes or lesions  Lymph nodes:   Cervical, supraclavicular, and axillary nodes normal  Neurologic:   CNII-XII intact, normal strength, sensation and reflexes    throughout  .    Assessment:    Healthy female exam.    Plan:     Breast self exam technique reviewed and patient encouraged to perform self-exam monthly.   Thin prep with cotesting (She is aware of ACOG recs) Fasting labs today

## 2014-11-05 LAB — CYTOLOGY - PAP

## 2014-11-06 ENCOUNTER — Telehealth: Payer: Self-pay | Admitting: *Deleted

## 2014-11-06 NOTE — Telephone Encounter (Signed)
-----   Message from Emily Filbert, MD sent at 11/06/2014  8:52 AM EDT ----- Please send her to a fam doc due to her worsening cholesterol.

## 2014-11-06 NOTE — Telephone Encounter (Signed)
Informed pt of results and Dr Hulan Fray recommendation to follow-up at [facility] with her PCP to follow her cholesterol.  Pt acknowledged and will follow-up with them.

## 2014-11-25 ENCOUNTER — Encounter: Payer: Self-pay | Admitting: Primary Care

## 2014-11-25 ENCOUNTER — Ambulatory Visit (INDEPENDENT_AMBULATORY_CARE_PROVIDER_SITE_OTHER): Payer: BLUE CROSS/BLUE SHIELD | Admitting: Primary Care

## 2014-11-25 VITALS — BP 124/84 | HR 100 | Temp 98.2°F | Ht 63.0 in | Wt 162.4 lb

## 2014-11-25 DIAGNOSIS — H6691 Otitis media, unspecified, right ear: Secondary | ICD-10-CM

## 2014-11-25 MED ORDER — AMOXICILLIN-POT CLAVULANATE 875-125 MG PO TABS: 1.0000 | ORAL_TABLET | Freq: Two times a day (BID) | ORAL | Status: DC

## 2014-11-25 NOTE — Progress Notes (Signed)
Pre visit review using our clinic review tool, if applicable. No additional management support is needed unless otherwise documented below in the visit note. 

## 2014-11-25 NOTE — Progress Notes (Signed)
Subjective:    Patient ID: Janet Porter, female    DOB: [birth year], 49 y.o.   MRN: [id number]  HPI  Janet Porter is a 49 year old female who presents today with a chief complaint of nasal congestion. Her symptoms also include ear pain, body aches, headache and cough. Her symptoms began Friday night. Shes taken one dose of cold and flu medication without improvement. Since Friday she's feeling worse.   Review of Systems  Constitutional: Positive for chills. Negative for fever.  HENT: Positive for congestion. Negative for ear pain, sinus pressure and sore throat.   Respiratory: Positive for cough. Negative for shortness of breath.   Cardiovascular: Negative for chest pain.  Musculoskeletal: Positive for myalgias.       Past Medical History  Diagnosis Date  . Headache(784.0)   . Depressive disorder, not elsewhere classified   . Anxiety state, unspecified   . Benign neoplasm of skin, site unspecified   . Unspecified sinusitis (chronic)   . 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 Pure hypercholesterolemia   . Abnormal Pap smear 2005  . Esophageal reflux     pt does not take meds    Social History   Social History  . Marital Status: Married    Spouse Name: N/A  . Number of Children: 3  . Years of Education: N/A   Occupational History  . Homemaker    Social History Main Topics  . Smoking status: Never Smoker   . Smokeless tobacco: Never Used  . Alcohol Use: No  . Drug Use: No  . Sexual Activity: Not on file   Other Topics Concern  . Not on file   Social History Narrative   Walks daily      Married x 13 years, no abuse      3 kids; ages 38-10      Homemaker      3 meals, limited fruits and veggies; sweet iced tea    Past Surgical History  Procedure Laterality Date  . Varicose vein surgery  2000  . Tonsillectomy  1979  . Colposcopy  2000    abn pap    . Tonsillectomy    . Adenoidectomy    . Varicose vein surgery    . Dilitation & currettage/hystroscopy with novasure ablation N/A 07/13/2012    Procedure: DILATATION & CURETTAGE/HYSTEROSCOPY WITH NOVASURE ABLATION;  Surgeon: Emily Filbert, MD;  Location: [facility];  Service: Gynecology;  Laterality: N/A;    Family History  Problem Relation Age of Onset  . Hypertension Father   . Diabetes Father   . Hyperlipidemia Father   . Healthy Mother   . Healthy Brother   . Pancreatic cancer Paternal Grandfather   . Cancer Maternal Grandfather     Oral  . Coronary artery disease Maternal Grandmother     Stents    No Known Allergies  Current Outpatient Prescriptions on File Prior to Visit  Medication Sig Dispense Refill  . citalopram (CELEXA) 20 MG tablet TAKE 1 TABLET (20 MG TOTAL) BY MOUTH DAILY. 90 tablet 0  . simvastatin (ZOCOR) 20 MG tablet TAKE 1 TABLET (20 MG TOTAL) BY MOUTH AT BEDTIME. 90 tablet 3   No current facility-administered medications on file prior to visit.    BP 124/84 mmHg  Pulse 100  Temp(Src) 98.2 F (36.8 C) (Oral)  Ht 5\' 3"  (1.6 m)  Wt 162 lb 6.4 oz (73.664 kg)  BMI 28.78 kg/m2  SpO2 98%    Objective:   Physical Exam  Constitutional: She appears well-nourished.  HENT:  Right Ear: Tympanic membrane is injected and bulging.  Left Ear: Tympanic membrane is not injected and not bulging.  Nose: Right sinus exhibits maxillary sinus tenderness. Left sinus exhibits maxillary sinus tenderness.  Mouth/Throat: Oropharynx is clear and moist.  Eyes: Conjunctivae are normal. Pupils are equal, round, and reactive to light.  Neck: Neck supple.  Cardiovascular: Normal rate and regular rhythm.   Pulmonary/Chest: Effort normal and breath sounds normal.  Lymphadenopathy:    She has no cervical adenopathy.  Skin: Skin is warm and dry.          Assessment & Plan:  Otitis Media:  Ear pain, fatigue, body aches, chills since Friday last week, worse today. Right TM  with injection and bulging. Left TM with mild erythema, no bulging. Lungs clear, appears tired. RX for Augmentin BID x 7 days. Discussed supportive treatment such as iburprofen, flonase, Delsym, fluids, rest. Return precautions provided.

## 2014-11-25 NOTE — Patient Instructions (Signed)
Start Augmentin antibiotics. Take 1 tablet by mouth twice daily for 7 days.   Nasal congestion: Fluticasone (Flonase) nasal spray. Instill 2 sprays in each nostril once daily.   Ear pain, headache, body aches: Ibuprofen 600 mg three times daily as needed.  Cough: Delsym or Robitussin.  Please call me if you're not feeling improved in the next 3-4 days.  It was a pleasure meeting you!  Otitis Media, Adult Otitis media is redness, soreness, and inflammation of the middle ear. Otitis media may be caused by allergies or, most commonly, by infection. Often it occurs as a complication of the common cold. SIGNS AND SYMPTOMS Symptoms of otitis media may include:  Earache.  Fever.  Ringing in your ear.  Headache.  Leakage of fluid from the ear. DIAGNOSIS To diagnose otitis media, your health care provider will examine your ear with an otoscope. This is an instrument that allows your health care provider to see into your ear in order to examine your eardrum. Your health care provider also will ask you questions about your symptoms. TREATMENT  Typically, otitis media resolves on its own within 3-5 days. Your health care provider may prescribe medicine to ease your symptoms of pain. If otitis media does not resolve within 5 days or is recurrent, your health care provider may prescribe antibiotic medicines if he or she suspects that a bacterial infection is the cause. HOME CARE INSTRUCTIONS   If you were prescribed an antibiotic medicine, finish it all even if you start to feel better.  Take medicines only as directed by your health care provider.  Keep all follow-up visits as directed by your health care provider. SEEK MEDICAL CARE IF:  You have otitis media only in one ear, or bleeding from your nose, or both.  You notice a lump on your neck.  You are not getting better in 3-5 days.  You feel worse instead of better. SEEK IMMEDIATE MEDICAL CARE IF:   You have pain that is not  controlled with medicine.  You have swelling, redness, or pain around your ear or stiffness in your neck.  You notice that part of your face is paralyzed.  You notice that the bone behind your ear (mastoid) is tender when you touch it. MAKE SURE YOU:   Understand these instructions.  Will watch your condition.  Will get help right away if you are not doing well or get worse.   This information is not intended to replace advice given to you by your health care provider. Make sure you discuss any questions you have with your health care provider.   Document Released: 10/09/2003 Document Revised: 01/24/2014 Document Reviewed: 07/31/2012 Elsevier Interactive Patient Education [employer].

## 2014-12-02 ENCOUNTER — Telehealth: Payer: Self-pay | Admitting: Family Medicine

## 2014-12-02 NOTE — Telephone Encounter (Signed)
PLEASE NOTE: All timestamps contained within this report are represented as [place] Standard Time. CONFIDENTIALTY NOTICE: This fax transmission is intended only for the addressee. It contains information that is legally privileged, confidential or otherwise protected from use or disclosure. If you are not the intended recipient, you are strictly prohibited from reviewing, disclosing, copying using or disseminating any of this information or taking any action in reliance on or regarding this information. If you have received this fax in error, please notify us immediately by telephone so that we can arrange for its return to [place]. Phone: [phone], Toll-Free: [phone], Fax: [phone] Page: 1 of 2 Call Id: [id number] [place] Patient Name: Janet Porter Gender: Female DOB: October 09, 1965 Age: 49 Y 40 M 19 D Return Phone Number: [phone] (Primary) Address: City/State/Zip: [place] Client [place] - Client Client Site [facility] - Day Physician Diona Browner, Amy Contact Type Call Call Type Triage / [place] Name [place] Relationship To Patient Self Appointment Disposition EMR Appointment Not Necessary Info pasted into Epic Yes Return Phone Number [phone] (Primary) Chief Complaint Nasal Congestion Initial Comment Caller states she was seen last Tuesday. DX- Ear infection, and virus. Head is stuffy, very tired. PreDisposition Call Doctor Nurse Assessment Nurse: Leilani Merl, RN, Nira Conn Date/Time Eilene Ghazi Time): 12/02/2014 8:51:42 AM Confirm and document reason for call. If symptomatic, describe symptoms. ---Caller states she was seen last Tuesday. DX- Ear infection, and virus. Head is stuffy, very tired. Has the patient traveled out of the country within the last 30 days? ---Not Applicable Does the patient have any new or worsening  symptoms? ---Yes Will a triage be completed? ---Yes Related visit to physician within the last 2 weeks? ---No Does the PT have any chronic conditions? (i.e. diabetes, asthma, etc.) ---Yes List chronic conditions. ---high cholesterol Did the patient indicate they were pregnant? ---No Guidelines Guideline Title Affirmed Question Affirmed Notes Nurse Date/Time Eilene Ghazi Time) Sinus Pain or Congestion [1] Sinus congestion (pressure, fullness) AND [2] present > 10 days Standifer, RN, Nira Conn 12/02/2014 8:53:12 AM Disp. Time Eilene Ghazi Time) Disposition Final User 12/02/2014 9:01:38 AM Call Completed Standifer, RN, Nira Conn 12/02/2014 8:57:33 AM See PCP When Office is Open (within 3 days) Yes Standifer, RN, Heather PLEASE NOTE: All timestamps contained within this report are represented as [place] Standard Time. CONFIDENTIALTY NOTICE: This fax transmission is intended only for the addressee. It contains information that is legally privileged, confidential or otherwise protected from use or disclosure. If you are not the intended recipient, you are strictly prohibited from reviewing, disclosing, copying using or disseminating any of this information or taking any action in reliance on or regarding this information. If you have received this fax in error, please notify us immediately by telephone so that we can arrange for its return to [place]. Phone: [phone], Toll-Free: [phone], Fax: [phone] Page: 2 of 2 Call Id: [id number] Caller Understands: Yes Disagree/Comply: Comply Care Advice Given Per Guideline SEE PCP WITHIN 3 DAYS: FOR A STUFFY NOSE - USE NASAL WASHES: * Introduction: Saline (salt water) nasal irrigation (nasal wash) is an effective and simple home remedy for treating stuffy nose and sinus congestion. The nose can be irrigated by pouring, spraying, or squirting salt water into the nose and then letting it run back out. NASAL DECONGESTANTS FOR A VERY STUFFY NOSE: * If you have  a very stuffy nose, nasal decongestant medicines can shrink the swollen nasal mucosa and  allow for easier breathing. If you have a very runny nose, these medicines can reduce the amount of drainage. They may be taken as pills by mouth or as a nasal spray. * PSEUDOEPHEDRINE (Sudafed) is available OTC in pill form. Typical adult dosage is two 30 mg tablets every 6 hours. IBUPROFEN (E.G., MOTRIN, ADVIL): * Take 400 mg (two 200 mg pills) by mouth every 6 hours as needed. * Another choice is to take 600 mg (three 200 mg pills) by mouth every 8 hours as needed. CALL BACK IF: * Difficulty breathing (and not relieved by cleaning out nose) * You become worse. CARE ADVICE given per Sinus Pain or Congestion (Adult) guideline. After Care Instructions Given Call Event Type User Date / Time Description Comments User: Ave Filter, RN Date/Time Eilene Ghazi Time): 12/02/2014 8:58:37 AM Caller states that she will try OTC medications and see if that helps and if not she will call the office back and make an appt.

## 2014-12-02 NOTE — Telephone Encounter (Signed)
Patient Name: LATECIA IPPOLITO  DOB: 14-Apr-1965    Initial Comment Caller states she was seen last Tuesday. DX- Ear infection, and virus. Head is stuffy, very tired.   Nurse Assessment  Nurse: Leilani Merl, RN, Heather Date/Time (Eastern Time): 12/02/2014 8:51:42 AM  Confirm and document reason for call. If symptomatic, describe symptoms. ---Caller states she was seen last Tuesday. DX- Ear infection, and virus. Head is stuffy, very tired.  Has the patient traveled out of the country within the last 30 days? ---Not Applicable  Does the patient have any new or worsening symptoms? ---Yes  Will a triage be completed? ---Yes  Related visit to physician within the last 2 weeks? ---No  Does the PT have any chronic conditions? (i.e. diabetes, asthma, etc.) ---Yes  List chronic conditions. ---high cholesterol  Did the patient indicate they were pregnant? ---No     Guidelines    Guideline Title Affirmed Question Affirmed Notes  Sinus Pain or Congestion [1] Sinus congestion (pressure, fullness) AND [2] present > 10 days    Final Disposition User   See PCP When Office is Open (within 3 days) Standifer, RN, Conservator, museum/gallery states that she will try OTC medications and see if that helps and if not she will call the office back and make an appt.   Disagree/Comply: Comply

## 2014-12-07 ENCOUNTER — Other Ambulatory Visit: Payer: Self-pay | Admitting: Family Medicine

## 2014-12-07 NOTE — Telephone Encounter (Signed)
Last office visit 11/25/2014 with Allie Bossier.  Last seen by Dr. Diona Browner 12/03/2013.  CPE 11/04/2014 with Dr. Hulan Fray.  Refill?

## 2015-03-11 ENCOUNTER — Other Ambulatory Visit: Payer: Self-pay | Admitting: Family Medicine

## 2015-03-11 NOTE — Telephone Encounter (Signed)
Last office visit 11/25/2014 with Gentry Fitz for ears.  Last appointment with Dr. Diona Browner 12/03/2013 .CPE 11/04/2014 with Dr. Hulan Fray.  No future appointments scheduled.  Refill?

## 2015-03-12 NOTE — Telephone Encounter (Signed)
Needs appt for mood re-eval. Refill until then.

## 2015-03-12 NOTE — Telephone Encounter (Signed)
Appointment scheduled for 03/17/2015 at 8:00am.  Patient states she has enough medication to get her to that appointment.

## 2015-03-17 ENCOUNTER — Ambulatory Visit (INDEPENDENT_AMBULATORY_CARE_PROVIDER_SITE_OTHER): Payer: BLUE CROSS/BLUE SHIELD | Admitting: Family Medicine

## 2015-03-17 ENCOUNTER — Encounter: Payer: Self-pay | Admitting: Family Medicine

## 2015-03-17 VITALS — BP 102/64 | HR 71 | Temp 98.6°F | Ht 63.0 in | Wt 167.8 lb

## 2015-03-17 DIAGNOSIS — E7800 Pure hypercholesterolemia, unspecified: Secondary | ICD-10-CM | POA: Diagnosis not present

## 2015-03-17 DIAGNOSIS — F411 Generalized anxiety disorder: Secondary | ICD-10-CM | POA: Diagnosis not present

## 2015-03-17 DIAGNOSIS — Z23 Encounter for immunization: Secondary | ICD-10-CM

## 2015-03-17 MED ORDER — SIMVASTATIN 20 MG PO TABS: ORAL_TABLET | ORAL | Status: DC

## 2015-03-17 MED ORDER — CITALOPRAM HYDROBROMIDE 20 MG PO TABS
ORAL_TABLET | ORAL | Status: DC
Start: 2015-03-17 — End: 2015-09-18

## 2015-03-17 NOTE — Progress Notes (Signed)
Pre visit review using our clinic review tool, if applicable. No additional management support is needed unless otherwise documented below in the visit note. 

## 2015-03-17 NOTE — Assessment & Plan Note (Signed)
Stable control on celexa. 

## 2015-03-17 NOTE — Assessment & Plan Note (Signed)
Well controlled except for trigs at last check. Continue current medication. Encouraged exercise, weight loss, healthy eating habits. Refilled medication.

## 2015-03-17 NOTE — Progress Notes (Signed)
   Subjective:    Patient ID: Janet Porter, female    DOB: March 30, 1965, 50 y.o.   MRN: [id number]  HPI  50 year old female presents with generalized anxiety for follow up.   She reports she is doing well overall on celexa 20 mg daily. NO SE to that medication. No insomnia, no SI, no HI. She has trid stopping the medication but sym[ptoms come back.  GAD 7: 3 PHQ9: 4  Social History /Family History/Past Medical History reviewed and updated if needed.    Review of Systems  Constitutional: Negative for fever and fatigue.  HENT: Negative for ear pain.   Eyes: Negative for pain.  Respiratory: Negative for chest tightness and shortness of breath.   Cardiovascular: Negative for chest pain, palpitations and leg swelling.  Gastrointestinal: Negative for abdominal pain.  Genitourinary: Negative for dysuria.       Objective:   Physical Exam  Constitutional: Vital signs are normal. She appears well-developed and well-nourished. She is cooperative.  Non-toxic appearance. She does not appear ill. No distress.  HENT:  Head: Normocephalic.  Right Ear: Hearing, tympanic membrane, external ear and ear canal normal. Tympanic membrane is not erythematous, not retracted and not bulging.  Left Ear: Hearing, tympanic membrane, external ear and ear canal normal. Tympanic membrane is not erythematous, not retracted and not bulging.  Nose: No mucosal edema or rhinorrhea. Right sinus exhibits no maxillary sinus tenderness and no frontal sinus tenderness. Left sinus exhibits no maxillary sinus tenderness and no frontal sinus tenderness.  Mouth/Throat: Uvula is midline, oropharynx is clear and moist and mucous membranes are normal.  Eyes: Conjunctivae, EOM and lids are normal. Pupils are equal, round, and reactive to light. Lids are everted and swept, no foreign bodies found.  Neck: Trachea normal and normal range of motion. Neck supple. Carotid bruit is not present. No thyroid mass and no thyromegaly  present.  Cardiovascular: Normal rate, regular rhythm, S1 normal, S2 normal, normal heart sounds, intact distal pulses and normal pulses.  Exam reveals no gallop and no friction rub.   No murmur heard. Pulmonary/Chest: Effort normal and breath sounds normal. No tachypnea. No respiratory distress. She has no decreased breath sounds. She has no wheezes. She has no rhonchi. She has no rales.  Abdominal: Soft. Normal appearance and bowel sounds are normal. There is no tenderness.  Neurological: She is alert.  Skin: Skin is warm, dry and intact. No rash noted.  Psychiatric: Her speech is normal and behavior is normal. Judgment and thought content normal. Her mood appears not anxious. Cognition and memory are normal. She does not exhibit a depressed mood.          Assessment & Plan:

## 2015-03-31 ENCOUNTER — Other Ambulatory Visit: Payer: Self-pay

## 2015-03-31 DIAGNOSIS — Z1231 Encounter for screening mammogram for malignant neoplasm of breast: Secondary | ICD-10-CM

## 2015-04-07 ENCOUNTER — Encounter: Payer: Self-pay | Admitting: Family Medicine

## 2015-04-07 ENCOUNTER — Ambulatory Visit (INDEPENDENT_AMBULATORY_CARE_PROVIDER_SITE_OTHER): Payer: BLUE CROSS/BLUE SHIELD | Admitting: Family Medicine

## 2015-04-07 VITALS — BP 110/62 | HR 67 | Temp 97.9°F | Wt 166.8 lb

## 2015-04-07 DIAGNOSIS — S80212A Abrasion, left knee, initial encounter: Secondary | ICD-10-CM | POA: Insufficient documentation

## 2015-04-07 NOTE — Assessment & Plan Note (Signed)
New- no indication of infection. Advised to keep a wet to dry dressing or bandaid with abx ointment on during the day - she works on her knees as a Secretary/administrator. At night- try to let it air out. Call or return to clinic prn if these symptoms worsen or fail to improve as anticipated. The patient indicates understanding of these issues and agrees with the plan.

## 2015-04-07 NOTE — Progress Notes (Signed)
Pre visit review using our clinic review tool, if applicable. No additional management support is needed unless otherwise documented below in the visit note. 

## 2015-04-07 NOTE — Progress Notes (Signed)
Subjective:   Patient ID: Janet Porter, female    DOB: 28-Nov-1965, 50 y.o.   MRN: [id number]  Janet Porter is a pleasant 50 y.o. year old female pt of Dr. Diona Browner, new to me, who presents to clinic today with Fall and Knee Pain  on 04/07/2015  HPI:  Left knee injury- was jogging one week ago and tripped and fell on sidewalk.  "scaped up" her left knee.  It has been healing but still painful so she wanted to be evaluated for infection.  No fevers or chills. No drainage from the wound.  No surrounding erythema or warmth.  Current Outpatient Prescriptions on File Prior to Visit  Medication Sig Dispense Refill  . citalopram (CELEXA) 20 MG tablet TAKE 1 TABLET (20 MG TOTAL) BY MOUTH DAILY. 90 tablet 1  . simvastatin (ZOCOR) 20 MG tablet TAKE 1 TABLET (20 MG TOTAL) BY MOUTH AT BEDTIME. 90 tablet 3   No current facility-administered medications on file prior to visit.    No Known Allergies  Past Medical History  Diagnosis Date  . Headache(784.0)   . Depressive disorder, not elsewhere classified   . Anxiety state, unspecified   . Benign neoplasm of skin, site unspecified   . Unspecified sinusitis (chronic)   . 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 Pure hypercholesterolemia   . Abnormal Pap smear 2005  . Esophageal reflux     pt does not take meds    Past Surgical History  Procedure Laterality Date  . Varicose vein surgery  2000  . Tonsillectomy  1979  . Colposcopy  2000    abn pap  . Tonsillectomy    . Adenoidectomy    . Varicose vein surgery    . Dilitation & currettage/hystroscopy with novasure ablation N/A 07/13/2012    Procedure: DILATATION & CURETTAGE/HYSTEROSCOPY WITH NOVASURE ABLATION;  Surgeon: Emily Filbert, MD;  Location: [facility];  Service: Gynecology;  Laterality: N/A;    Family History  Problem Relation Age of Onset  . Hypertension Father   .  Diabetes Father   . Hyperlipidemia Father   . Healthy Mother   . Healthy Brother   . Pancreatic cancer Paternal Grandfather   . Cancer Maternal Grandfather     Oral  . Coronary artery disease Maternal Grandmother     Stents    Social History   Social History  . Marital Status: Married    Spouse Name: N/A  . Number of Children: 3  . Years of Education: N/A   Occupational History  . Homemaker    Social History Main Topics  . Smoking status: Never Smoker   . Smokeless tobacco: Never Used  . Alcohol Use: No  . Drug Use: No  . Sexual Activity: Not on file   Other Topics Concern  . Not on file   Social History Narrative   Walks daily      Married x 13 years, no abuse      3 kids; ages 6-10      Homemaker      3 meals, limited fruits and veggies; sweet iced tea   The PMH, PSH, Social History, Family History, Medications, and allergies have been reviewed in [facility], and have been updated if relevant.   Review of Systems  Constitutional: Negative for fever.  Gastrointestinal: Negative.   Skin: Positive for wound. Negative for color change, pallor and rash.  Neurological: Negative.  Hematological: Negative.   All other systems reviewed and are negative.      Objective:    BP 110/62 mmHg  Pulse 67  Temp(Src) 97.9 F (36.6 C) (Oral)  Wt 166 lb 12 oz (75.637 kg)  SpO2 97%   Physical Exam  Constitutional: She is oriented to person, place, and time. She appears well-developed and well-nourished. No distress.  HENT:  Head: Normocephalic and atraumatic.  Eyes: Conjunctivae are normal.  Cardiovascular: Normal rate.   Pulmonary/Chest: Effort normal.  Musculoskeletal: Normal range of motion.  Neurological: She is alert and oriented to person, place, and time. No cranial nerve deficit.  Skin: She is not diaphoretic.     Psychiatric: She has a normal mood and affect. Her behavior is normal. Judgment and thought content normal.  Vitals reviewed.           Assessment & Plan:   Abrasion of knee, left, initial encounter No Follow-up on file.

## 2015-05-12 ENCOUNTER — Ambulatory Visit: Payer: BLUE CROSS/BLUE SHIELD

## 2015-05-14 ENCOUNTER — Ambulatory Visit
Admission: RE | Admit: 2015-05-14 | Discharge: 2015-05-14 | Disposition: A | Discharge status: Home / Self Care | Payer: BLUE CROSS/BLUE SHIELD | Source: Ambulatory Visit

## 2015-05-14 DIAGNOSIS — Z1231 Encounter for screening mammogram for malignant neoplasm of breast: Secondary | ICD-10-CM

## 2015-09-18 ENCOUNTER — Other Ambulatory Visit: Payer: Self-pay | Admitting: Family Medicine

## 2015-09-30 ENCOUNTER — Encounter: Payer: Self-pay | Admitting: Family Medicine

## 2015-09-30 ENCOUNTER — Ambulatory Visit (INDEPENDENT_AMBULATORY_CARE_PROVIDER_SITE_OTHER): Payer: BLUE CROSS/BLUE SHIELD | Admitting: Family Medicine

## 2015-09-30 VITALS — BP 138/84 | HR 84 | Temp 98.5°F | Ht 63.0 in | Wt 169.5 lb

## 2015-09-30 DIAGNOSIS — H6981 Other specified disorders of Eustachian tube, right ear: Secondary | ICD-10-CM | POA: Diagnosis not present

## 2015-09-30 NOTE — Progress Notes (Signed)
Pre visit review using our clinic review tool, if applicable. No additional management support is needed unless otherwise documented below in the visit note. 

## 2015-09-30 NOTE — Progress Notes (Signed)
Dr. Frederico Hamman T. Madelynne Lasker, MD, [facility] [place], [address] Phone: [phone] Fax: [phone]  09/30/2015  Patient: Janet Porter, MRN: [id number], DOB: 1965-11-07, 50 y.o.  Primary Physician:  Eliezer Lofts, MD   Chief Complaint  Patient presents with  . Ear Pain    Right   Subjective:   Janet Porter is a 50 y.o. very pleasant female patient who presents with the following:  Ear ache on the right and a cold last week.  No other symptoms now  Past Medical History, Surgical History, Social History, Family History, Problem List, Medications, and Allergies have been reviewed and updated if relevant.  Patient Active Problem List   Diagnosis Date Noted  . Abrasion of knee, left 04/07/2015  . Calcific tendonitis of left lower leg 12/03/2013  . Elevated liver function tests 10/18/2013  . Prediabetes 10/18/2013  . DUB (dysfunctional uterine bleeding) 07/13/2012  . Perimenopausal vasomotor symptoms 05/03/2011  . PALPITATIONS 09/01/2009  . Generalized anxiety disorder 03/21/2007  . TB, RESPIRATORY NEC, HISTOLOGIC DX 09/22/2006  . HYPERCHOLESTEROLEMIA 09/22/2006  . GERD 09/22/2006    Past Medical History:  Diagnosis Date  . Abnormal Pap smear 2005  . Anxiety state, unspecified   . Benign neoplasm of skin, site unspecified   . Depressive disorder, not elsewhere classified   . Esophageal reflux    pt does not take meds  . Headache(784.0)   . 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 Pure hypercholesterolemia   . Unspecified sinusitis (chronic)     Past Surgical History:  Procedure Laterality Date  . ADENOIDECTOMY    . COLPOSCOPY  2000   abn pap  . DILITATION & CURRETTAGE/HYSTROSCOPY WITH NOVASURE ABLATION N/A 07/13/2012   Procedure: DILATATION & CURETTAGE/HYSTEROSCOPY WITH NOVASURE ABLATION;  Surgeon:  Emily Filbert, MD;  Location: [facility];  Service: Gynecology;  Laterality: N/A;  . TONSILLECTOMY  1979  . TONSILLECTOMY    . VARICOSE VEIN SURGERY  2000  . VARICOSE VEIN SURGERY      Social History   Social History  . Marital status: Married    Spouse name: N/A  . Number of children: 3  . Years of education: N/A   Occupational History  . Homemaker    Social History Main Topics  . Smoking status: Never Smoker  . Smokeless tobacco: Never Used  . Alcohol use No  . Drug use: No  . Sexual activity: Not on file   Other Topics Concern  . Not on file   Social History Narrative   Walks daily      Married x 13 years, no abuse      3 kids; ages 59-10      Homemaker      3 meals, limited fruits and veggies; sweet iced tea    Family History  Problem Relation Age of Onset  . Hypertension Father   . Diabetes Father   . Hyperlipidemia Father   . Healthy Mother   . Healthy Brother   . Pancreatic cancer Paternal Grandfather   . Cancer Maternal Grandfather     Oral  . Coronary artery disease Maternal Grandmother     Stents    No Known Allergies  Medication list reviewed and updated in full in [facility].  ROS: GEN: Acute illness details above GI: Tolerating PO intake GU: maintaining adequate hydration and urination Pulm: No  SOB Interactive and getting along well at home.  Otherwise, ROS is as per the HPI.  Objective:   BP 138/84   Pulse 84   Temp 98.5 F (36.9 C) (Oral)   Ht 5\' 3"  (1.6 m)   Wt 169 lb 8 oz (76.9 kg)   BMI 30.03 kg/m    Gen: WDWN, NAD; A & O x3, cooperative. Pleasant.Globally Non-toxic HEENT: Normocephalic and atraumatic. Throat clear, w/o exudate, R TM serous fluid, L TM - good landmarks, No fluid present. no rhinnorhea.  MMM Frontal sinuses: NT Max sinuses: NT NECK: Anterior cervical  LAD is absent CV: RRR, No M/G/R, cap refill <2 sec PULM: Breathing comfortably in no respiratory distress. no wheezing, crackles, rhonchi EXT: No  c/c/e PSYCH: Friendly, good eye contact MSK: Nml gait     Laboratory and Imaging Data:  Assessment and Plan:   ETD (eustachian tube dysfunction), right  Patient Instructions  Eustachian Tube Dysfunction: There is a tube that connects between the sinuses and behind the ear called the "eustachian tube." Sometimes when you have allergies, a cold, or nasal congestion for any reason this tube can get blocked and pressure cannot equalize in your ears. (Like if you swim in deep water) This can also trap fluid behind the ear and give you a full, pressure-like sensation that is uncomfortable, but it is not an ear infection.  Recommendations: Afrin Nasal Spray: 2 sprays twice a day for a maximum of 3-4 days (longer than this and your nose gets addicted, and you have rebound swelling that makes it worse.) Sudafed: Either pseudoephedrine or phenylephrine. As directed on box. (Not is heart problems or high blood pressure) Anti-Histamine: Allegra, Zyrtec, or Claritin. All over the counter now and once a day.  Nasal steroid. Nasacort is over the counter now. About 10 prescription ones exist.  If you develop fever > 100.4, then things can change fluid behind the ear does increase your risk of developing an ear infection.     Follow-up: No Follow-up on file.  Signed,  Maud Deed. Makinsley Schiavi, MD   Patient's Medications  New Prescriptions   No medications on file  Previous Medications   CITALOPRAM (CELEXA) 20 MG TABLET    TAKE 1 TABLET (20 MG TOTAL) BY MOUTH DAILY.   SIMVASTATIN (ZOCOR) 20 MG TABLET    TAKE 1 TABLET (20 MG TOTAL) BY MOUTH AT BEDTIME.  Modified Medications   No medications on file  Discontinued Medications   No medications on file

## 2015-09-30 NOTE — Patient Instructions (Signed)
Eustachian Tube Dysfunction: There is a tube that connects between the sinuses and behind the ear called the "eustachian tube." Sometimes when you have allergies, a cold, or nasal congestion for any reason this tube can get blocked and pressure cannot equalize in your ears. (Like if you swim in deep water) This can also trap fluid behind the ear and give you a full, pressure-like sensation that is uncomfortable, but it is not an ear infection.  Recommendations: Afrin Nasal Spray: 2 sprays twice a day for a maximum of 3-4 days (longer than this and your nose gets addicted, and you have rebound swelling that makes it worse.) Sudafed: Either pseudoephedrine or phenylephrine. As directed on box. (Not is heart problems or high blood pressure) Anti-Histamine: Allegra, Zyrtec, or Claritin. All over the counter now and once a day.  Nasal steroid. Nasacort is over the counter now. About 10 prescription ones exist.  If you develop fever > 100.4, then things can change fluid behind the ear does increase your risk of developing an ear infection.  

## 2015-11-10 ENCOUNTER — Encounter: Payer: Self-pay | Admitting: Obstetrics & Gynecology

## 2015-11-10 ENCOUNTER — Encounter: Payer: Self-pay | Admitting: *Deleted

## 2015-11-10 ENCOUNTER — Ambulatory Visit (INDEPENDENT_AMBULATORY_CARE_PROVIDER_SITE_OTHER): Payer: BLUE CROSS/BLUE SHIELD | Admitting: Obstetrics & Gynecology

## 2015-11-10 VITALS — BP 125/84 | HR 73 | Resp 18 | Ht 63.0 in | Wt 167.0 lb

## 2015-11-10 DIAGNOSIS — Z1151 Encounter for screening for human papillomavirus (HPV): Secondary | ICD-10-CM

## 2015-11-10 DIAGNOSIS — Z124 Encounter for screening for malignant neoplasm of cervix: Secondary | ICD-10-CM

## 2015-11-10 DIAGNOSIS — Z01419 Encounter for gynecological examination (general) (routine) without abnormal findings: Secondary | ICD-10-CM

## 2015-11-10 LAB — CBC
HCT: 43.1 % (ref 35.0–45.0)
HEMOGLOBIN: 14.4 g/dL (ref 11.7–15.5)
MCH: 31 pg (ref 27.0–33.0)
MCHC: 33.4 g/dL (ref 32.0–36.0)
MCV: 92.9 fL (ref 80.0–100.0)
MPV: 11 fL (ref 7.5–12.5)
Platelets: 221 10*3/uL (ref 140–400)
RBC: 4.64 MIL/uL (ref 3.80–5.10)
RDW: 13.2 % (ref 11.0–15.0)
WBC: 5.3 10*3/uL (ref 3.8–10.8)

## 2015-11-10 LAB — TSH: TSH: 3.21 m[IU]/L

## 2015-11-10 NOTE — Progress Notes (Signed)
Subjective:    Janet Porter is a 50 y.o. MW P3 (39, 44, and 64 yo kids)  female who presents for an annual exam. The patient has no complaints today. The patient is sexually active. GYN screening history: last pap: was normal. The patient wears seatbelts: yes. The patient participates in regular exercise: yes. Has the patient ever been transfused or tattooed?: no. The patient reports that there is not domestic violence in her life.   Menstrual History: OB History    Gravida Para Term Preterm AB Living   3 3       3    SAB TAB Ectopic Multiple Live Births                  Menarche age: 77 No LMP recorded. Patient has had an ablation.    The following portions of the patient's history were reviewed and updated as appropriate: allergies, current medications, past family history, past medical history, past social history, past surgical history and problem list.  Review of Systems Pertinent items are noted in HPI.   She had an ablation in about 2013 and only has very tiny, somewhat crampy periods Had flu vaccine and mammogram. Some hot flashes, no vaginal dryness   Objective:    BP 125/84 (BP Location: Left Arm, Patient Position: Sitting, Cuff Size: Large)   Pulse 73   Resp 18   Ht 5\' 3"  (1.6 m)   Wt 167 lb (75.8 kg)   BMI 29.58 kg/m   General Appearance:    Alert, cooperative, no distress, appears stated age  Head:    Normocephalic, without obvious abnormality, atraumatic  Eyes:    PERRL, conjunctiva/corneas clear, EOM's intact, fundi    benign, both eyes  Ears:    Normal TM's and external ear canals, both ears  Nose:   Nares normal, septum midline, mucosa normal, no drainage    or sinus tenderness  Throat:   Lips, mucosa, and tongue normal; teeth and gums normal  Neck:   Supple, symmetrical, trachea midline, no adenopathy;    thyroid:  no enlargement/tenderness/nodules; no carotid   bruit or JVD  Back:     Symmetric, no curvature, ROM normal, no CVA tenderness  Lungs:      Clear to auscultation bilaterally, respirations unlabored  Chest Wall:    No tenderness or deformity   Heart:    Regular rate and rhythm, S1 and S2 normal, no murmur, rub   or gallop  Breast Exam:    No tenderness, masses, or nipple abnormality  Abdomen:     Soft, non-tender, bowel sounds active all four quadrants,    no masses, no organomegaly  Genitalia:    Normal female without lesion, discharge or tenderness, NSSA, NT, no adnexal masses palpable, Marked hemorrhoids (She declines proctofoam/gen surg)     Extremities:   Extremities normal, atraumatic, no cyanosis or edema  Pulses:   2+ and symmetric all extremities  Skin:   Skin color, texture, turgor normal, no rashes or lesions  Lymph nodes:   Cervical, supraclavicular, and axillary nodes normal  Neurologic:   CNII-XII intact, normal strength, sensation and reflexes    throughout   .    Assessment:    Healthy female exam.    Plan:     Thin prep Pap smear. with cotesting (aware of ACOG recs) Fasting labs (Dr. Diona Browner manages her lipids)

## 2015-11-11 LAB — CYTOLOGY - PAP
Diagnosis: NEGATIVE
HPV (WINDOPATH): NOT DETECTED

## 2015-11-11 LAB — COMPREHENSIVE METABOLIC PANEL
ALBUMIN: 4.5 g/dL (ref 3.6–5.1)
ALT: 50 U/L — ABNORMAL HIGH (ref 6–29)
AST: 39 U/L — ABNORMAL HIGH (ref 10–35)
Alkaline Phosphatase: 75 U/L (ref 33–115)
BUN: 13 mg/dL (ref 7–25)
CALCIUM: 8.9 mg/dL (ref 8.6–10.2)
CHLORIDE: 103 mmol/L (ref 98–110)
CO2: 25 mmol/L (ref 20–31)
Creat: 0.78 mg/dL (ref 0.50–1.10)
Glucose, Bld: 95 mg/dL (ref 65–99)
POTASSIUM: 3.9 mmol/L (ref 3.5–5.3)
Sodium: 139 mmol/L (ref 135–146)
TOTAL PROTEIN: 7.2 g/dL (ref 6.1–8.1)
Total Bilirubin: 0.9 mg/dL (ref 0.2–1.2)

## 2015-11-11 LAB — LIPID PANEL
CHOL/HDL RATIO: 5.2 ratio — AB (ref <=5.0)
CHOLESTEROL: 178 mg/dL (ref 125–200)
HDL: 34 mg/dL — AB (ref >=46)
LDL CALC: 98 mg/dL (ref <130)
TRIGLYCERIDES: 232 mg/dL — AB (ref <150)
VLDL: 46 mg/dL — AB (ref <30)

## 2015-11-11 LAB — VITAMIN D 25 HYDROXY (VIT D DEFICIENCY, FRACTURES): VIT D 25 HYDROXY: 26 ng/mL — AB (ref 30–100)

## 2015-11-14 DIAGNOSIS — R51 Headache: Secondary | ICD-10-CM | POA: Diagnosis not present

## 2015-11-17 ENCOUNTER — Telehealth: Payer: Self-pay | Admitting: *Deleted

## 2015-11-17 ENCOUNTER — Encounter: Payer: Self-pay | Admitting: *Deleted

## 2015-11-17 NOTE — Telephone Encounter (Signed)
-----   Message from Emily Filbert, MD sent at 11/16/2015  3:53 PM EDT ----- She will need to see her FP about her LFTs and lipids. Thanks

## 2015-11-17 NOTE — Telephone Encounter (Signed)
Called pt but no answer and no voice mail.

## 2016-03-26 ENCOUNTER — Other Ambulatory Visit: Payer: Self-pay | Admitting: Family Medicine

## 2016-05-05 ENCOUNTER — Other Ambulatory Visit: Payer: Self-pay | Admitting: Family Medicine

## 2016-05-10 ENCOUNTER — Other Ambulatory Visit (HOSPITAL_COMMUNITY): Payer: Self-pay | Admitting: Obstetrics & Gynecology

## 2016-05-10 DIAGNOSIS — Z1231 Encounter for screening mammogram for malignant neoplasm of breast: Secondary | ICD-10-CM

## 2016-05-25 ENCOUNTER — Ambulatory Visit
Admission: RE | Admit: 2016-05-25 | Discharge: 2016-05-25 | Disposition: A | Discharge status: Home / Self Care | Payer: BLUE CROSS/BLUE SHIELD | Source: Ambulatory Visit | Attending: Obstetrics & Gynecology | Admitting: Obstetrics & Gynecology

## 2016-05-25 DIAGNOSIS — Z1231 Encounter for screening mammogram for malignant neoplasm of breast: Secondary | ICD-10-CM

## 2016-06-08 ENCOUNTER — Other Ambulatory Visit: Payer: Self-pay | Admitting: *Deleted

## 2016-06-08 NOTE — Telephone Encounter (Signed)
Last office visit 09/30/2015 with Dr. Lorelei Pont.  Last Lipid done 11/10/2015 with Dr. Clovia Cuff.  No future appointments here at [facility].  Refill?  Pharmacy is requesting 90 day supply.

## 2016-06-09 MED ORDER — SIMVASTATIN 20 MG PO TABS: ORAL_TABLET | ORAL | 0 refills | Status: DC

## 2016-06-20 ENCOUNTER — Other Ambulatory Visit: Payer: Self-pay | Admitting: Family Medicine

## 2016-06-22 ENCOUNTER — Encounter: Payer: Self-pay | Admitting: Internal Medicine

## 2016-06-22 ENCOUNTER — Ambulatory Visit (INDEPENDENT_AMBULATORY_CARE_PROVIDER_SITE_OTHER): Payer: BLUE CROSS/BLUE SHIELD | Admitting: Internal Medicine

## 2016-06-22 DIAGNOSIS — L237 Allergic contact dermatitis due to plants, except food: Secondary | ICD-10-CM

## 2016-06-22 MED ORDER — PREDNISONE 20 MG PO TABS: 40.0000 mg | ORAL_TABLET | Freq: Every day | ORAL | 0 refills | Status: DC

## 2016-06-22 NOTE — Assessment & Plan Note (Signed)
Rx for prednisone and advised to use cortisone otc cream on her face but maximum 5 days.

## 2016-06-22 NOTE — Patient Instructions (Signed)
We have sent in the prednisone to clear this up. Take 2 pills daily for 5 days. If this is not better call us back.

## 2016-06-22 NOTE — Progress Notes (Signed)
   Subjective:    Patient ID: Janet Porter, female    DOB: June 21, 1965, 51 y.o.   MRN: [id number]  HPI The patient is a 51 YO female coming in for possible poison ivy on her stomach and face. Her puppy was out running around outside. Started about 2 weeks ago and was severe. She has been using some otc cream on the area which is helping mildly. She is concerned as the spot on her face is spreading some and she is worried about it being close to her eye.   Review of Systems  Constitutional: Negative.   Respiratory: Negative.   Cardiovascular: Negative.   Gastrointestinal: Negative.   Musculoskeletal: Negative.   Skin: Positive for rash.  Neurological: Negative.       Objective:   Physical Exam  Constitutional: She appears well-developed and well-nourished.  HENT:  Head: Normocephalic and atraumatic.  Cardiovascular: Normal rate and regular rhythm.   Pulmonary/Chest: Effort normal.  Abdominal: Soft.  Skin: Skin is warm and dry. Rash noted.  Poison ivy on the face with area left of the nostril and also on the stomach about 7-8 cm circular   Vitals:   06/22/16 1054  BP: 130/64  Pulse: 68  Resp: 12  Temp: 98.4 F (36.9 C)  TempSrc: Oral  SpO2: 98%  Weight: 168 lb (76.2 kg)  Height: 5\' 3"  (1.6 m)      Assessment & Plan:

## 2016-06-30 ENCOUNTER — Other Ambulatory Visit: Payer: Self-pay | Admitting: Family Medicine

## 2016-07-19 ENCOUNTER — Other Ambulatory Visit: Payer: Self-pay | Admitting: Family Medicine

## 2016-08-29 ENCOUNTER — Other Ambulatory Visit: Payer: Self-pay

## 2016-08-29 NOTE — Telephone Encounter (Signed)
Pt has an appt in September for CPE but is out of medication now. Can she get refills on the citalopram and simvastatin to last until her appt mext month?

## 2016-08-30 MED ORDER — CITALOPRAM HYDROBROMIDE 20 MG PO TABS: 20.0000 mg | ORAL_TABLET | Freq: Every day | ORAL | 0 refills | Status: DC

## 2016-08-30 MED ORDER — SIMVASTATIN 20 MG PO TABS: ORAL_TABLET | ORAL | 0 refills | Status: DC

## 2016-08-30 NOTE — Telephone Encounter (Signed)
Spoke with pt and verified which pharmacy these needed to go to. These both were sent in. She is aware that she must keep her appt for future refills. Nothing further is needed

## 2016-08-30 NOTE — Telephone Encounter (Signed)
Yes.. Please provide.

## 2016-09-27 ENCOUNTER — Encounter: Payer: Self-pay | Admitting: Family Medicine

## 2016-09-27 ENCOUNTER — Ambulatory Visit (INDEPENDENT_AMBULATORY_CARE_PROVIDER_SITE_OTHER): Payer: BLUE CROSS/BLUE SHIELD | Admitting: Family Medicine

## 2016-09-27 ENCOUNTER — Other Ambulatory Visit: Payer: Self-pay | Admitting: Family Medicine

## 2016-09-27 VITALS — BP 102/70 | HR 74 | Temp 98.4°F | Ht 63.25 in | Wt 168.0 lb

## 2016-09-27 DIAGNOSIS — R7303 Prediabetes: Secondary | ICD-10-CM

## 2016-09-27 DIAGNOSIS — Z0000 Encounter for general adult medical examination without abnormal findings: Secondary | ICD-10-CM | POA: Diagnosis not present

## 2016-09-27 DIAGNOSIS — Z23 Encounter for immunization: Secondary | ICD-10-CM

## 2016-09-27 DIAGNOSIS — F411 Generalized anxiety disorder: Secondary | ICD-10-CM

## 2016-09-27 DIAGNOSIS — E7800 Pure hypercholesterolemia, unspecified: Secondary | ICD-10-CM | POA: Diagnosis not present

## 2016-09-27 LAB — LIPID PANEL
CHOL/HDL RATIO: 4
Cholesterol: 173 mg/dL (ref 0–200)
HDL: 39 mg/dL — AB (ref >39.00)
LDL CALC: 96 mg/dL (ref 0–99)
NONHDL: 133.73
TRIGLYCERIDES: 191 mg/dL — AB (ref 0.0–149.0)
VLDL: 38.2 mg/dL (ref 0.0–40.0)

## 2016-09-27 LAB — COMPREHENSIVE METABOLIC PANEL
ALT: 64 U/L — AB (ref 0–35)
AST: 43 U/L — ABNORMAL HIGH (ref 0–37)
Albumin: 4.5 g/dL (ref 3.5–5.2)
Alkaline Phosphatase: 74 U/L (ref 39–117)
BUN: 13 mg/dL (ref 6–23)
CALCIUM: 9.2 mg/dL (ref 8.4–10.5)
CO2: 27 meq/L (ref 19–32)
Chloride: 106 mEq/L (ref 96–112)
Creatinine, Ser: 0.7 mg/dL (ref 0.40–1.20)
GFR: 94.55 mL/min (ref >60.00)
GLUCOSE: 109 mg/dL — AB (ref 70–99)
POTASSIUM: 4.4 meq/L (ref 3.5–5.1)
Sodium: 140 mEq/L (ref 135–145)
Total Bilirubin: 0.7 mg/dL (ref 0.2–1.2)
Total Protein: 6.7 g/dL (ref 6.0–8.3)

## 2016-09-27 LAB — HEMOGLOBIN A1C: Hgb A1c MFr Bld: 5.1 % (ref 4.6–6.5)

## 2016-09-27 NOTE — Addendum Note (Signed)
Addended byEliezer Lofts E on: 09/27/2016 03:00 PM   Modules accepted: Orders

## 2016-09-27 NOTE — Patient Instructions (Signed)
Please stop at the lab to have labs drawn.  

## 2016-09-27 NOTE — Progress Notes (Signed)
Subjective:    Patient ID: Janet Porter, female    DOB: [birth year], 51 y.o.   MRN: [id number]  HPI  51 year old female presents for annual.  She sees GYN for CPX  on 10/2015.   GAD:  Stable control on Celexa.  No panic attacks. Sleep okay at night.  GAD7: 3   PHQ2: 0  Elevated Cholesterol: Due for re-eval on zocor 20 mg daily, but ran out  in last month.. Now restart. Using medications without problems: none Muscle aches:  none Diet compliance: healthy, she does like meat and cheese. Exercise: walks daily.Marland Kitchen Physical job with cleaning houses Other complaints:   Social History /Family History/Past Medical History reviewed in detail and updated in EMR if needed. Blood pressure 102/70, pulse 74, temperature 98.4 F (36.9 C), temperature source Oral, height 5' 3.25" (1.607 m), weight 168 lb (76.2 kg).  Body mass index is 29.53 kg/m.   Review of Systems  Constitutional: Negative for fatigue and fever.  HENT: Negative for congestion.   Eyes: Negative for pain.  Respiratory: Negative for cough and shortness of breath.   Cardiovascular: Negative for chest pain, palpitations and leg swelling.  Gastrointestinal: Negative for abdominal pain.  Genitourinary: Negative for dysuria and vaginal bleeding.  Musculoskeletal: Negative for back pain.  Neurological: Negative for syncope, light-headedness and headaches.  Psychiatric/Behavioral: Negative for dysphoric mood.       Objective:   Physical Exam  Constitutional: Vital signs are normal. She appears well-developed and well-nourished. She is cooperative.  Non-toxic appearance. She does not appear ill. No distress.  HENT:  Head: Normocephalic.  Right Ear: Hearing, tympanic membrane, external ear and ear canal normal.  Left Ear: Hearing, tympanic membrane, external ear and ear canal normal.  Nose: Nose normal.  Eyes: Pupils are equal, round, and reactive to light. Conjunctivae, EOM and lids are normal. Lids are everted and swept,  no foreign bodies found.  Neck: Trachea normal and normal range of motion. Neck supple. Carotid bruit is not present. No thyroid mass and no thyromegaly present.  Cardiovascular: Normal rate, regular rhythm, S1 normal, S2 normal, normal heart sounds and intact distal pulses.  Exam reveals no gallop.   No murmur heard. Pulmonary/Chest: Effort normal and breath sounds normal. No respiratory distress. She has no wheezes. She has no rhonchi. She has no rales.  Abdominal: Soft. Normal appearance and bowel sounds are normal. She exhibits no distension, no fluid wave, no abdominal bruit and no mass. There is no hepatosplenomegaly. There is no tenderness. There is no rebound, no guarding and no CVA tenderness. No hernia.  Lymphadenopathy:    She has no cervical adenopathy.    She has no axillary adenopathy.  Neurological: She is alert. She has normal strength. No cranial nerve deficit or sensory deficit.  Skin: Skin is warm, dry and intact. No rash noted.  Psychiatric: Her speech is normal and behavior is normal. Judgment normal. Her mood appears not anxious. Cognition and memory are normal. She does not exhibit a depressed mood.          Assessment & Plan:  The patient's preventative maintenance and recommended screening tests for an annual wellness exam were reviewed in full today. Brought up to date unless services declined.  Counselled on the importance of diet, exercise, and its role in overall health and mortality. The patient's FH and SH was reviewed, including their home life, tobacco status, and drug and alcohol status.   Vaccines: Given flu today. Pap/DVE:  10/2015  with GYN Mammo:  05/2016 Colon:  No early family history Smoking Status: none ETOH/ drug use: none/none HIV screen:   refused

## 2016-11-28 ENCOUNTER — Other Ambulatory Visit: Payer: Self-pay | Admitting: Family Medicine

## 2017-01-04 ENCOUNTER — Encounter: Payer: Self-pay | Admitting: Obstetrics & Gynecology

## 2017-01-04 ENCOUNTER — Ambulatory Visit (INDEPENDENT_AMBULATORY_CARE_PROVIDER_SITE_OTHER): Payer: BLUE CROSS/BLUE SHIELD | Admitting: Obstetrics & Gynecology

## 2017-01-04 VITALS — BP 130/83 | HR 86 | Wt 171.6 lb

## 2017-01-04 DIAGNOSIS — Z124 Encounter for screening for malignant neoplasm of cervix: Secondary | ICD-10-CM

## 2017-01-04 DIAGNOSIS — Z1151 Encounter for screening for human papillomavirus (HPV): Secondary | ICD-10-CM | POA: Diagnosis not present

## 2017-01-04 DIAGNOSIS — Z01419 Encounter for gynecological examination (general) (routine) without abnormal findings: Secondary | ICD-10-CM

## 2017-01-04 DIAGNOSIS — Z803 Family history of malignant neoplasm of breast: Secondary | ICD-10-CM | POA: Diagnosis not present

## 2017-01-04 NOTE — Progress Notes (Signed)
PAP 2017

## 2017-01-04 NOTE — Addendum Note (Signed)
Addended by: Phillip Heal, DEMETRICE A on: 01/04/2017 10:48 AM   Modules accepted: Miquel Dunn

## 2017-01-04 NOTE — Progress Notes (Signed)
Subjective:    Janet Porter is a 51 y.o. married White P3 (21, 16, and 75 yo kids) female who presents for an annual exam. The patient has no complaints today. The patient is sexually active. GYN screening history: last pap: was normal. The patient wears seatbelts: yes. The patient participates in regular exercise: yes. Has the patient ever been transfused or tattooed?: no. The patient reports that there is not domestic violence in her life.   Menstrual History: OB History    Gravida Para Term Preterm AB Living   3 3       3    SAB TAB Ectopic Multiple Live Births                  Menarche age: 54 No LMP recorded. Patient has had an ablation.    The following portions of the patient's history were reviewed and updated as appropriate: allergies, current medications, past family history, past medical history, past social history, past surgical history and problem list.  Review of Systems Pertinent items are noted in HPI.   Married for 24 years Uses condoms She had an ablation Works for herself, cleaning business No colon, gyn cancer + breast in her paternal uncle    Objective:    BP 130/83   Pulse 86   Wt 171 lb 9.6 oz (77.8 kg)   BMI 30.16 kg/m   General Appearance:    Alert, cooperative, no distress, appears stated age  Head:    Normocephalic, without obvious abnormality, atraumatic  Eyes:    PERRL, conjunctiva/corneas clear, EOM's intact, fundi    benign, both eyes  Ears:    Normal TM's and external ear canals, both ears  Nose:   Nares normal, septum midline, mucosa normal, no drainage    or sinus tenderness  Throat:   Lips, mucosa, and tongue normal; teeth and gums normal  Neck:   Supple, symmetrical, trachea midline, no adenopathy;    thyroid:  no enlargement/tenderness/nodules; no carotid   bruit or JVD  Back:     Symmetric, no curvature, ROM normal, no CVA tenderness  Lungs:     Clear to auscultation bilaterally, respirations unlabored  Chest Wall:    No  tenderness or deformity   Heart:    Regular rate and rhythm, S1 and S2 normal, no murmur, rub   or gallop  Breast Exam:    No tenderness, masses, or nipple abnormality  Abdomen:     Soft, non-tender, bowel sounds active all four quadrants,    no masses, no organomegaly  Genitalia:    Normal female without lesion, discharge or tenderness, NSSA, NT, no adnexal masses     Extremities:   Extremities normal, atraumatic, no cyanosis or edema  Pulses:   2+ and symmetric all extremities  Skin:   Skin color, texture, turgor normal, no rashes or lesions  Lymph nodes:   Cervical, supraclavicular, and axillary nodes normal  Neurologic:   CNII-XII intact, normal strength, sensation and reflexes    throughout  .    Assessment:    Healthy female exam.   + breast cancer in paternal uncle   Plan:     Thin prep Pap smear. with cotesting [facility] testing

## 2017-01-06 LAB — CYTOLOGY - PAP
Diagnosis: NEGATIVE
HPV: NOT DETECTED

## 2017-01-18 ENCOUNTER — Encounter: Payer: Self-pay | Admitting: *Deleted

## 2017-02-13 ENCOUNTER — Telehealth: Payer: Self-pay

## 2017-02-13 NOTE — Telephone Encounter (Signed)
Call patient no answer or voice mail to leave a message. Patient should be referred to [facility] for generic testing for inconclusive results from [facility].

## 2017-02-13 NOTE — Telephone Encounter (Signed)
-----   Message from Emily Filbert, MD sent at 02/09/2017  8:29 AM EST ----- I can't remember if she has already been instructed to speak with the free genetic counselor, but if she has not already done this, then please let her know to do so.  Ps. I'm only sending you this because you were already doing another one for me. Thanks

## 2017-03-15 DIAGNOSIS — F411 Generalized anxiety disorder: Secondary | ICD-10-CM | POA: Diagnosis not present

## 2017-03-29 DIAGNOSIS — F411 Generalized anxiety disorder: Secondary | ICD-10-CM | POA: Diagnosis not present

## 2017-06-06 ENCOUNTER — Other Ambulatory Visit (HOSPITAL_COMMUNITY): Payer: Self-pay | Admitting: Obstetrics & Gynecology

## 2017-06-06 DIAGNOSIS — Z1231 Encounter for screening mammogram for malignant neoplasm of breast: Secondary | ICD-10-CM

## 2017-06-28 ENCOUNTER — Ambulatory Visit
Admission: RE | Admit: 2017-06-28 | Discharge: 2017-06-28 | Disposition: A | Discharge status: Home / Self Care | Payer: BLUE CROSS/BLUE SHIELD | Source: Ambulatory Visit | Attending: Obstetrics & Gynecology | Admitting: Obstetrics & Gynecology

## 2017-06-28 DIAGNOSIS — Z1231 Encounter for screening mammogram for malignant neoplasm of breast: Secondary | ICD-10-CM

## 2017-08-08 DIAGNOSIS — L239 Allergic contact dermatitis, unspecified cause: Secondary | ICD-10-CM | POA: Diagnosis not present

## 2017-08-22 DIAGNOSIS — L237 Allergic contact dermatitis due to plants, except food: Secondary | ICD-10-CM | POA: Diagnosis not present

## 2017-10-11 ENCOUNTER — Other Ambulatory Visit: Payer: Self-pay | Admitting: Family Medicine

## 2017-10-18 ENCOUNTER — Ambulatory Visit: Payer: BLUE CROSS/BLUE SHIELD | Admitting: Internal Medicine

## 2017-10-18 ENCOUNTER — Encounter: Payer: Self-pay | Admitting: Internal Medicine

## 2017-10-18 VITALS — BP 120/76 | HR 88 | Temp 98.3°F | Wt 171.0 lb

## 2017-10-18 DIAGNOSIS — Z23 Encounter for immunization: Secondary | ICD-10-CM | POA: Diagnosis not present

## 2017-10-18 DIAGNOSIS — J301 Allergic rhinitis due to pollen: Secondary | ICD-10-CM

## 2017-10-18 MED ORDER — METHYLPREDNISOLONE ACETATE 80 MG/ML IJ SUSP
80.0000 mg | Freq: Once | INTRAMUSCULAR | Status: AC
  Administered 2017-10-18: 80 mg via INTRAMUSCULAR

## 2017-10-18 NOTE — Progress Notes (Signed)
HPI  Pt presents to the clinic today with c/o post nasal drip and cough. She reports this has been intermittent for the last 4 months. She denies runny nose, nasal congestion, ear pain or sore throat. She denies fever, chills or body aches. She has tried Dayquil and Nyquil OTC with minimal relief. She has no history of allergies or asthma. She has not had sick contacts.  Review of Systems      Past Medical History:  Diagnosis Date  . Abnormal Pap smear 2005  . Anxiety state, unspecified   . Benign neoplasm of skin, site unspecified   . Depressive disorder, not elsewhere classified   . Esophageal reflux    pt does not take meds  . Headache(784.0)   . 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 Pure hypercholesterolemia   . Unspecified sinusitis (chronic)     Family History  Problem Relation Age of Onset  . Hypertension Father   . Diabetes Father   . Hyperlipidemia Father   . Healthy Mother   . Healthy Brother   . Pancreatic cancer Paternal Grandfather   . Cancer Maternal Grandfather        Oral  . Coronary artery disease Maternal Grandmother        Stents    Social History   Socioeconomic History  . Marital status: Married    Spouse name: Not on file  . Number of children: 3  . Years of education: Not on file  . Highest education level: Not on file  Occupational History  . Occupation: Agricultural engineer  Social Needs  . Financial resource strain: Not on file  . Food insecurity:    Worry: Not on file    Inability: Not on file  . Transportation needs:    Medical: Not on file    Non-medical: Not on file  Tobacco Use  . Smoking status: Never Smoker  . Smokeless tobacco: Never Used  Substance and Sexual Activity  . Alcohol use: No  . Drug use: No  . Sexual activity: Yes    Partners: Male    Birth control/protection: Condom  Lifestyle  . Physical activity:    Days  per week: Not on file    Minutes per session: Not on file  . Stress: Not on file  Relationships  . Social connections:    Talks on phone: Not on file    Gets together: Not on file    Attends religious service: Not on file    Active member of club or organization: Not on file    Attends meetings of clubs or organizations: Not on file    Relationship status: Not on file  . Intimate partner violence:    Fear of current or ex partner: Not on file    Emotionally abused: Not on file    Physically abused: Not on file    Forced sexual activity: Not on file  Other Topics Concern  . Not on file  Social History Narrative   Walks daily      Married x 13 years, no abuse      3 kids; ages 61-10      Homemaker      3 meals, limited fruits and veggies; sweet iced tea    No Known Allergies   Constitutional:  Denies headache, fatigue, fever or abrupt weight changes.  HEENT:  Denies eye redness, eye pain, pressure behind the eyes, facial pain, nasal congestion,  ear pain, ringing in the ears, wax buildup, runny nose or sore throat. Respiratory: Positive cough. Denies difficulty breathing or shortness of breath.  Cardiovascular: Denies chest pain, chest tightness, palpitations or swelling in the hands or feet.   No other specific complaints in a complete review of systems (except as listed in HPI above).  Objective:   BP 120/76   Pulse 88   Temp 98.3 F (36.8 C) (Oral)   Wt 171 lb (77.6 kg)   BMI 30.05 kg/m  Wt Readings from Last 3 Encounters:  10/18/17 171 lb (77.6 kg)  01/04/17 171 lb 9.6 oz (77.8 kg)  09/27/16 168 lb (76.2 kg)     General: Appears her stated age, well developed, well nourished in NAD. HEENT: Head: normal shape and size, no sinus tenderness noted;  Ears: Tm's gray and intact, normal light reflex; Nose: mucosa pink and moist, septum midline; Throat/Mouth: + PND. Teeth present, mucosa epink and moist, no exudate noted, no lesions or ulcerations noted.  Neck: No  cervical lymphadenopathy.  Cardiovascular: Normal rate and rhythm. S1,S2 noted.  No murmur, rubs or gallops noted.  Pulmonary/Chest: Normal effort and positive vesicular breath sounds. No respiratory distress. No wheezes, rales or ronchi noted.       Assessment & Plan:   Allergic Rhinitis:  Get some rest and drink plenty of water 80 mg Depo IM today Start Zyrtec OTC  RTC as needed or if symptoms persist.   Webb Silversmith, NP

## 2017-10-18 NOTE — Addendum Note (Signed)
Addended by: Lurlean Nanny on: 10/18/2017 02:52 PM   Modules accepted: Orders

## 2017-10-18 NOTE — Patient Instructions (Signed)

## 2017-11-27 ENCOUNTER — Telehealth: Payer: Self-pay | Admitting: Family Medicine

## 2017-11-27 DIAGNOSIS — E7800 Pure hypercholesterolemia, unspecified: Secondary | ICD-10-CM

## 2017-11-27 DIAGNOSIS — R7303 Prediabetes: Secondary | ICD-10-CM

## 2017-11-27 NOTE — Telephone Encounter (Signed)
-----   Message from Lendon Collar, RT sent at 11/21/2017  9:34 AM EST ----- Regarding: Lab orders for Tuesday 11/28/17 Please enter CPE lab orders for 11/28/17. Thanks!

## 2017-11-28 ENCOUNTER — Other Ambulatory Visit (INDEPENDENT_AMBULATORY_CARE_PROVIDER_SITE_OTHER): Payer: BLUE CROSS/BLUE SHIELD

## 2017-11-28 ENCOUNTER — Encounter: Payer: Self-pay | Admitting: Radiology

## 2017-11-28 DIAGNOSIS — E7800 Pure hypercholesterolemia, unspecified: Secondary | ICD-10-CM | POA: Diagnosis not present

## 2017-11-28 DIAGNOSIS — R7303 Prediabetes: Secondary | ICD-10-CM | POA: Diagnosis not present

## 2017-11-28 LAB — LIPID PANEL
CHOLESTEROL: 171 mg/dL (ref 0–200)
HDL: 48 mg/dL (ref >39.00)
LDL Cholesterol: 102 mg/dL — ABNORMAL HIGH (ref 0–99)
NONHDL: 122.78
Total CHOL/HDL Ratio: 4
Triglycerides: 103 mg/dL (ref 0.0–149.0)
VLDL: 20.6 mg/dL (ref 0.0–40.0)

## 2017-11-28 LAB — COMPREHENSIVE METABOLIC PANEL
ALT: 39 U/L — ABNORMAL HIGH (ref 0–35)
AST: 24 U/L (ref 0–37)
Albumin: 4.5 g/dL (ref 3.5–5.2)
Alkaline Phosphatase: 69 U/L (ref 39–117)
BUN: 17 mg/dL (ref 6–23)
CALCIUM: 9.2 mg/dL (ref 8.4–10.5)
CHLORIDE: 105 meq/L (ref 96–112)
CO2: 29 meq/L (ref 19–32)
Creatinine, Ser: 0.76 mg/dL (ref 0.40–1.20)
GFR: 85.57 mL/min (ref >60.00)
Glucose, Bld: 106 mg/dL — ABNORMAL HIGH (ref 70–99)
Potassium: 4.6 mEq/L (ref 3.5–5.1)
Sodium: 140 mEq/L (ref 135–145)
Total Bilirubin: 0.7 mg/dL (ref 0.2–1.2)
Total Protein: 7.2 g/dL (ref 6.0–8.3)

## 2017-11-28 LAB — HEMOGLOBIN A1C: Hgb A1c MFr Bld: 5.4 % (ref 4.6–6.5)

## 2017-11-30 ENCOUNTER — Encounter: Payer: Self-pay | Admitting: Family Medicine

## 2017-11-30 ENCOUNTER — Ambulatory Visit (INDEPENDENT_AMBULATORY_CARE_PROVIDER_SITE_OTHER): Payer: BLUE CROSS/BLUE SHIELD | Admitting: Family Medicine

## 2017-11-30 VITALS — BP 131/70 | HR 79 | Temp 98.3°F | Ht 63.5 in | Wt 168.2 lb

## 2017-11-30 DIAGNOSIS — R7989 Other specified abnormal findings of blood chemistry: Secondary | ICD-10-CM

## 2017-11-30 DIAGNOSIS — R945 Abnormal results of liver function studies: Secondary | ICD-10-CM

## 2017-11-30 DIAGNOSIS — Z0000 Encounter for general adult medical examination without abnormal findings: Secondary | ICD-10-CM

## 2017-11-30 DIAGNOSIS — Z1211 Encounter for screening for malignant neoplasm of colon: Secondary | ICD-10-CM

## 2017-11-30 DIAGNOSIS — F411 Generalized anxiety disorder: Secondary | ICD-10-CM | POA: Diagnosis not present

## 2017-11-30 DIAGNOSIS — E7800 Pure hypercholesterolemia, unspecified: Secondary | ICD-10-CM

## 2017-11-30 DIAGNOSIS — R7303 Prediabetes: Secondary | ICD-10-CM

## 2017-11-30 MED ORDER — SIMVASTATIN 20 MG PO TABS: ORAL_TABLET | ORAL | 3 refills | Status: DC

## 2017-11-30 MED ORDER — CITALOPRAM HYDROBROMIDE 20 MG PO TABS: 20.0000 mg | ORAL_TABLET | Freq: Every day | ORAL | 1 refills | Status: DC

## 2017-11-30 NOTE — Addendum Note (Signed)
Addended by: Carter Kitten on: 11/30/2017 09:25 AM   Modules accepted: Orders

## 2017-11-30 NOTE — Patient Instructions (Addendum)
Please stop at the front desk to set up referral.  Keep up working on low carb and low fat diet.  Keep up with regular exercise.

## 2017-11-30 NOTE — Progress Notes (Signed)
Subjective:    Patient ID: Janet Porter, female    DOB: Jun 02, 1965, 52 y.o.   MRN: [id number]  HPI The patient is here for annual wellness exam and preventative care.    Elevated Cholesterol: LDL almost at goal on simvastatin.  Lab Results  Component Value Date   CHOL 171 11/28/2017   HDL 48.00 11/28/2017   LDLCALC 102 (H) 11/28/2017   LDLDIRECT 151.2 01/24/2012   TRIG 103.0 11/28/2017   CHOLHDL 4 11/28/2017  Using medications without problems:none Muscle aches: none Diet compliance: eats too much sugar,  picky about veggies,  Exercise:  Walking, cleans house for a living. Other complaints:  .Body mass index is 29.34 kg/m.  Prediabetes;  Lab Results  Component Value Date   HGBA1C 5.4 11/28/2017    GAD, stable control on celexa.   GAD 7 5.   Elevated LFTs:  MRI 10/2013  Hemangioma, and fatty liver.  Social History /Family History/Past Medical History reviewed in detail and updated in EMR if needed.  Blood pressure 120/90, pulse 79, temperature 98.3 F (36.8 C), temperature source Oral, height 5' 3.5" (1.613 m), weight 168 lb 4 oz (76.3 kg).   BP Readings from Last 3 Encounters:  11/30/17 120/90  10/18/17 120/76  01/04/17 130/83    Review of Systems  Constitutional: Negative for fatigue and fever.  HENT: Negative for congestion.   Eyes: Negative for pain.  Respiratory: Negative for cough and shortness of breath.   Cardiovascular: Negative for chest pain, palpitations and leg swelling.  Gastrointestinal: Negative for abdominal pain.  Genitourinary: Negative for dysuria and vaginal bleeding.  Musculoskeletal: Negative for back pain.  Neurological: Negative for syncope, light-headedness and headaches.  Psychiatric/Behavioral: Negative for dysphoric mood.       Objective:   Physical Exam  Constitutional: Vital signs are normal. She appears well-developed and well-nourished. She is cooperative.  Non-toxic appearance. She does not appear ill. No distress.    HENT:  Head: Normocephalic.  Right Ear: Hearing, tympanic membrane, external ear and ear canal normal.  Left Ear: Hearing, tympanic membrane, external ear and ear canal normal.  Nose: Nose normal.  Eyes: Pupils are equal, round, and reactive to light. Conjunctivae, EOM and lids are normal. Lids are everted and swept, no foreign bodies found.  Neck: Trachea normal and normal range of motion. Neck supple. Carotid bruit is not present. No thyroid mass and no thyromegaly present.  Cardiovascular: Normal rate, regular rhythm, S1 normal, S2 normal, normal heart sounds and intact distal pulses. Exam reveals no gallop.  No murmur heard. Pulmonary/Chest: Effort normal and breath sounds normal. No respiratory distress. She has no wheezes. She has no rhonchi. She has no rales.  Abdominal: Soft. Normal appearance and bowel sounds are normal. She exhibits no distension, no fluid wave, no abdominal bruit and no mass. There is no hepatosplenomegaly. There is no tenderness. There is no rebound, no guarding and no CVA tenderness. No hernia.  Lymphadenopathy:    She has no cervical adenopathy.    She has no axillary adenopathy.  Neurological: She is alert. She has normal strength. No cranial nerve deficit or sensory deficit.  Skin: Skin is warm, dry and intact. No rash noted.   Small < 0.2 cm dark lesion central chest, raised.. Per pt no changes.  Psychiatric: Her speech is normal and behavior is normal. Judgment normal. Her mood appears not anxious. Cognition and memory are normal. She does not exhibit a depressed mood.  Assessment & Plan:  The patient's preventative maintenance and recommended screening tests for an annual wellness exam were reviewed in full today. Brought up to date unless services declined.  Counselled on the importance of diet, exercise, and its role in overall health and mortality. The patient's FH and SH was reviewed, including their home life, tobacco status, and drug  and alcohol status.   Vaccines:  uptodate with flu and td. Pap/DVE: 12/2016  with GYN Mammo:  06/28/2017 Colon:  No early family history.. due Smoking Status: none ETOH/ drug use: none/none HIV screen:  refused

## 2017-12-08 ENCOUNTER — Telehealth: Payer: Self-pay | Admitting: Family Medicine

## 2017-12-08 ENCOUNTER — Encounter: Payer: Self-pay | Admitting: Gastroenterology

## 2017-12-08 NOTE — Telephone Encounter (Signed)
Tried calling pt regarding referral no voice mail

## 2018-01-08 ENCOUNTER — Ambulatory Visit (AMBULATORY_SURGERY_CENTER): Payer: Self-pay | Admitting: *Deleted

## 2018-01-08 VITALS — Ht 63.0 in | Wt 169.0 lb

## 2018-01-08 DIAGNOSIS — Z1211 Encounter for screening for malignant neoplasm of colon: Secondary | ICD-10-CM

## 2018-01-08 MED ORDER — NA SULFATE-K SULFATE-MG SULF 17.5-3.13-1.6 GM/177ML PO SOLN: 1.0000 | Freq: Once | ORAL | 0 refills | Status: AC

## 2018-01-08 NOTE — Progress Notes (Signed)
No egg or soy allergy known to patient  No issues with past sedation with any surgeries  or procedures, no intubation problems  No diet pills per patient No home 02 use per patient  No blood thinners per patient  Pt denies issues with constipation  No A fib or A flutter  EMMI video sent to pt's e mail -- pt declined  RS colon date with pt in PV today- changed to 1-17 Friday at 130p arrive at 1230 pm

## 2018-01-25 ENCOUNTER — Encounter: Payer: BLUE CROSS/BLUE SHIELD | Admitting: Gastroenterology

## 2018-02-02 ENCOUNTER — Encounter: Payer: BLUE CROSS/BLUE SHIELD | Admitting: Gastroenterology

## 2018-02-02 ENCOUNTER — Encounter: Payer: Self-pay | Admitting: Gastroenterology

## 2018-02-05 ENCOUNTER — Ambulatory Visit (INDEPENDENT_AMBULATORY_CARE_PROVIDER_SITE_OTHER): Payer: BLUE CROSS/BLUE SHIELD | Admitting: Obstetrics & Gynecology

## 2018-02-05 ENCOUNTER — Encounter: Payer: Self-pay | Admitting: Obstetrics & Gynecology

## 2018-02-05 VITALS — BP 132/83 | HR 72 | Wt 171.0 lb

## 2018-02-05 DIAGNOSIS — Z01419 Encounter for gynecological examination (general) (routine) without abnormal findings: Secondary | ICD-10-CM

## 2018-02-05 DIAGNOSIS — Z1151 Encounter for screening for human papillomavirus (HPV): Secondary | ICD-10-CM

## 2018-02-05 DIAGNOSIS — Z124 Encounter for screening for malignant neoplasm of cervix: Secondary | ICD-10-CM

## 2018-02-05 NOTE — Progress Notes (Signed)
No sti testing  Mammogram 04/2017 Last pap 01/04/2017

## 2018-02-05 NOTE — Progress Notes (Signed)
Subjective:    Janet Porter is a 53 y.o. P3 (no grands)  female who presents for an annual exam. The patient has no complaints today. The patient is sexually active. GYN screening history: last pap: was normal. The patient wears seatbelts: yes. The patient participates in regular exercise: yes. Has the patient ever been transfused or tattooed?: no. The patient reports that there is not domestic violence in her life.   Menstrual History: OB History    Gravida  3   Para  3   Term      Preterm      AB      Living  3     SAB      TAB      Ectopic      Multiple      Live Births              Menarche age: 78 No LMP recorded. Patient has had an ablation.    The following portions of the patient's history were reviewed and updated as appropriate: allergies, current medications, past family history, past medical history, past social history, past surgical history and problem list.  Review of Systems Pertinent items are noted in HPI.   Married for 25 years She had an ablation about 10 years ago, has an occasional period FH- no breast/gyn/colon cancer She has a colonoscopy 02/16/18    Objective:    BP 132/83   Pulse 72   Wt 171 lb (77.6 kg)   BMI 30.29 kg/m   General Appearance:    Alert, cooperative, no distress, appears stated age  Head:    Normocephalic, without obvious abnormality, atraumatic  Eyes:    PERRL, conjunctiva/corneas clear, EOM's intact, fundi    benign, both eyes  Ears:    Normal TM's and external ear canals, both ears  Nose:   Nares normal, septum midline, mucosa normal, no drainage    or sinus tenderness  Throat:   Lips, mucosa, and tongue normal; teeth and gums normal  Neck:   Supple, symmetrical, trachea midline, no adenopathy;    thyroid:  no enlargement/tenderness/nodules; no carotid   bruit or JVD  Back:     Symmetric, no curvature, ROM normal, no CVA tenderness  Lungs:     Clear to auscultation bilaterally, respirations unlabored   Chest Wall:    No tenderness or deformity   Heart:    Regular rate and rhythm, S1 and S2 normal, no murmur, rub   or gallop  Breast Exam:    No tenderness, masses, or nipple abnormality  Abdomen:     Soft, non-tender, bowel sounds active all four quadrants,    no masses, no organomegaly  Genitalia:    Normal female without lesion, discharge or tenderness, normal size and shape, anteverted, mobile, non-tender, normal adnexal exam      Extremities:   Extremities normal, atraumatic, no cyanosis or edema  Pulses:   2+ and symmetric all extremities  Skin:   Skin color, texture, turgor normal, no rashes or lesions  Lymph nodes:   Cervical, supraclavicular, and axillary nodes normal  Neurologic:   CNII-XII intact, normal strength, sensation and reflexes    throughout  .    Assessment:    Healthy female exam.    Plan:     Thin prep Pap smear. with cotesting

## 2018-02-09 LAB — CYTOLOGY - PAP
DIAGNOSIS: NEGATIVE
HPV (WINDOPATH): NOT DETECTED

## 2018-02-16 ENCOUNTER — Encounter: Payer: Self-pay | Admitting: Gastroenterology

## 2018-02-16 ENCOUNTER — Ambulatory Visit (AMBULATORY_SURGERY_CENTER): Payer: BLUE CROSS/BLUE SHIELD | Admitting: Gastroenterology

## 2018-02-16 VITALS — BP 115/68 | HR 66 | Temp 97.5°F | Resp 14 | Ht 63.0 in | Wt 169.0 lb

## 2018-02-16 DIAGNOSIS — Z1211 Encounter for screening for malignant neoplasm of colon: Secondary | ICD-10-CM

## 2018-02-16 DIAGNOSIS — K635 Polyp of colon: Secondary | ICD-10-CM | POA: Diagnosis not present

## 2018-02-16 DIAGNOSIS — D125 Benign neoplasm of sigmoid colon: Secondary | ICD-10-CM | POA: Diagnosis not present

## 2018-02-16 MED ORDER — SODIUM CHLORIDE 0.9 % IV SOLN: 500.0000 mL | INTRAVENOUS | Status: DC

## 2018-02-16 NOTE — Progress Notes (Signed)
Pt's states no medical or surgical changes since previsit or office visit. 

## 2018-02-16 NOTE — Patient Instructions (Signed)
Handouts given on polyps. Repeat colonoscopy in 5 years given your family history You will receive a letter in 2-3 weeks about the pathology on the polyp that was removed today.   YOU HAD AN ENDOSCOPIC PROCEDURE TODAY AT [facility]:   Refer to the procedure report that was given to you for any specific questions about what was found during the examination.  If the procedure report does not answer your questions, please call your gastroenterologist to clarify.  If you requested that your care partner not be given the details of your procedure findings, then the procedure report has been included in a sealed envelope for you to review at your convenience later.  YOU SHOULD EXPECT: Some feelings of bloating in the abdomen. Passage of more gas than usual.  Walking can help get rid of the air that was put into your GI tract during the procedure and reduce the bloating. If you had a lower endoscopy (such as a colonoscopy or flexible sigmoidoscopy) you may notice spotting of blood in your stool or on the toilet paper. If you underwent a bowel prep for your procedure, you may not have a normal bowel movement for a few days.  Please Note:  You might notice some irritation and congestion in your nose or some drainage.  This is from the oxygen used during your procedure.  There is no need for concern and it should clear up in a day or so.  SYMPTOMS TO REPORT IMMEDIATELY:   Following lower endoscopy (colonoscopy or flexible sigmoidoscopy):  Excessive amounts of blood in the stool  Significant tenderness or worsening of abdominal pains  Swelling of the abdomen that is new, acute  Fever of 100F or higher   For urgent or emergent issues, a gastroenterologist can be reached at any hour by calling [phone].   DIET:  We do recommend a small meal at first, but then you may proceed to your regular diet.  Drink plenty of fluids but you should avoid alcoholic beverages for 24  hours.  ACTIVITY:  You should plan to take it easy for the rest of today and you should NOT DRIVE or use heavy machinery until tomorrow (because of the sedation medicines used during the test).    FOLLOW UP: Our staff will call the number listed on your records the next business day following your procedure to check on you and address any questions or concerns that you may have regarding the information given to you following your procedure. If we do not reach you, we will leave a message.  However, if you are feeling well and you are not experiencing any problems, there is no need to return our call.  We will assume that you have returned to your regular daily activities without incident.  If any biopsies were taken you will be contacted by phone or by letter within the next 1-3 weeks.  Please call us at [phone] if you have not heard about the biopsies in 3 weeks.    SIGNATURES/CONFIDENTIALITY: You and/or your care partner have signed paperwork which will be entered into your electronic medical record.  These signatures attest to the fact that that the information above on your After Visit Summary has been reviewed and is understood.  Full responsibility of the confidentiality of this discharge information lies with you and/or your care-partner.

## 2018-02-16 NOTE — Progress Notes (Signed)
To PACU, VSS. Report to [email or web address] 

## 2018-02-16 NOTE — Op Note (Signed)
[facility] Patient Name: Janet Porter Procedure Date: 02/16/2018 9:50 AM MRN: [id number] Endoscopist: Thornton Park MD, MD Age: 53 Referring MD:  Date of Birth: 1965-06-07 Gender: Female Account #: [id number] Procedure:                Colonoscopy Indications:              Screening for colorectal malignant neoplasm, This                            is the patient's first colonoscopy. Mother with                            colon polyps on initial colonoscopy requiring                            surveillance. No known family history of colon                            cancer or polyps. Chronic constipation not                            requiring medical therapy. No other chronic GI                            symptoms. Medicines:                See the Anesthesia note for documentation of the                            administered medications Procedure:                Pre-Anesthesia Assessment:                           - Prior to the procedure, a History and Physical                            was performed, and patient medications and                            allergies were reviewed. The patient's tolerance of                            previous anesthesia was also reviewed. The risks                            and benefits of the procedure and the sedation                            options and risks were discussed with the patient.                            All questions were answered, and informed consent  was obtained. Prior Anticoagulants: The patient has                            taken no previous anticoagulant or antiplatelet                            agents. ASA Grade Assessment: II - A patient with                            mild systemic disease. After reviewing the risks                            and benefits, the patient was deemed in                            satisfactory condition to undergo the procedure.           After obtaining informed consent, the colonoscope                            was passed under direct vision. Throughout the                            procedure, the patient's blood pressure, pulse, and                            oxygen saturations were monitored continuously. The                            Colonoscope was introduced through the anus and                            advanced to the the terminal ileum, with                            identification of the appendiceal orifice and IC                            valve. The colonoscopy was performed without                            difficulty. The patient tolerated the procedure                            well. The quality of the bowel preparation was                            good. The terminal ileum, ileocecal valve,                            appendiceal orifice, and rectum were photographed. Scope In: 10:01:24 AM Scope Out: 10:22:00 AM Scope Withdrawal Time: 0 hours 16 minutes 21 seconds  Total Procedure Duration: 0 hours 20 minutes 36 seconds  Findings:  The perianal and digital rectal examinations were                            normal except for reducible rectal prolapse.                           A 3 mm polyp was found in the distal sigmoid colon.                            The polyp was sessile. The polyp was removed with a                            cold snare. Resection and retrieval were complete.                            Estimated blood loss: none.                           The exam was otherwise without abnormality on                            direct and retroflexion views. Complications:            No immediate complications. Estimated Blood Loss:     Estimated blood loss was minimal. Impression:               - One 3 mm polyp in the distal sigmoid colon,                            removed with a cold snare. Resected and retrieved.                           - The examination was  otherwise normal on direct                            and retroflexion views. Recommendation:           - Patient has a contact number available for                            emergencies. The signs and symptoms of potential                            delayed complications were discussed with the                            patient. Return to normal activities tomorrow.                            Written discharge instructions were provided to the                            patient.                           -  Resume regular diet.                           - Continue present medications.                           - Await pathology results.                           - Repeat colonoscopy in 5 years for surveillance                            regardless of pathology given the family history of                            colon polyps. Thornton Park MD, MD 02/16/2018 10:28:35 AM This report has been signed electronically.

## 2018-02-16 NOTE — Progress Notes (Signed)
Called to room to assist during endoscopic procedure.  Patient ID and intended procedure confirmed with present staff. Received instructions for my participation in the procedure from the performing physician.  

## 2018-02-19 ENCOUNTER — Telehealth: Payer: Self-pay | Admitting: *Deleted

## 2018-02-19 NOTE — Telephone Encounter (Signed)
  Follow up Call-  Call back number 02/16/2018  Post procedure Call Back phone  # [phone]  Permission to leave phone message No  Some recent data might be hidden     Patient questions:  Do you have a fever, pain , or abdominal swelling? No. Pain Score  0 *  Have you tolerated food without any problems? Yes.    Have you been able to return to your normal activities? Yes.    Do you have any questions about your discharge instructions: Diet   No. Medications  No. Follow up visit  No.  Do you have questions or concerns about your Care? No.  Actions: * If pain score is 4 or above: No action needed, pain <4.

## 2018-02-23 ENCOUNTER — Encounter: Payer: Self-pay | Admitting: Gastroenterology

## 2018-03-27 ENCOUNTER — Telehealth: Payer: Self-pay

## 2018-03-27 ENCOUNTER — Encounter: Payer: Self-pay | Admitting: Family Medicine

## 2018-03-27 ENCOUNTER — Ambulatory Visit: Payer: BLUE CROSS/BLUE SHIELD | Admitting: Family Medicine

## 2018-03-27 VITALS — BP 126/88 | HR 95 | Resp 14 | Ht 63.5 in | Wt 172.2 lb

## 2018-03-27 DIAGNOSIS — R079 Chest pain, unspecified: Secondary | ICD-10-CM | POA: Diagnosis not present

## 2018-03-27 DIAGNOSIS — M79602 Pain in left arm: Secondary | ICD-10-CM

## 2018-03-27 NOTE — Patient Instructions (Signed)
Most likely chest pain due to reflux, Gi irritation versus muscle pain.  Start gentle stretching of upper chest wall and arms.  Avoid acidic foods such as tomato, citrus, alcohol, caffeine, chocolate, spicy foods etc.  Start prilosec OTC 20 mg daily x  2 weeks. Go to ER if severe shortness of breath or chest pain.

## 2018-03-27 NOTE — Progress Notes (Signed)
Subjective:    Patient ID: Janet Porter, female    DOB: 1965/03/25, 53 y.o.   MRN: [id number]  HPI   53 year old female with history of GAD, mildly high cholesterol, prediabetes presents with new onset pain in left arm in last 2 weeks.  Off and on . Sometimes pain in  forearm and some in upper arm.  Also occ pain in right arm, but not as much. Last night , sudden onset, she doing the dishes, occurred after dinner, sharp , lasted 15 mins.  She had lasagne for dinner.  No associated SOB, dizzineess, no heartburn, nausea. No change with exertion.   Now small bit of mild pain, cramping, momentary, off and on today.  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 Uses left hand more than right.   She does take occ advil for headache.   Denies anxiety.  No family history of premature CAD.  Social History /Family History/Past Medical History reviewed in detail and updated in EMR if needed. Blood pressure 126/88, pulse 95, resp. rate 14, height 5' 3.5" (1.613 m), weight 172 lb 4 oz (78.1 kg), SpO2 97 %.   Review of Systems  Constitutional: Negative for fatigue and fever.  HENT: Negative for congestion.   Eyes: Negative for pain.  Respiratory: Negative for cough and shortness of breath.   Cardiovascular: Positive for chest pain. Negative for palpitations and leg swelling.  Gastrointestinal: Negative for abdominal pain.  Genitourinary: Negative for dysuria and vaginal bleeding.  Musculoskeletal: Negative for back pain.  Neurological: Negative for syncope, light-headedness and headaches.  Psychiatric/Behavioral: Negative for dysphoric mood.       Objective:   Physical Exam Constitutional:      General: She is not in acute distress.    Appearance: Normal appearance. She is well-developed. She is not ill-appearing or toxic-appearing.  HENT:     Head: Normocephalic.     Right Ear: Hearing, tympanic membrane, ear canal and external ear normal. Tympanic membrane is not  erythematous, retracted or bulging.     Left Ear: Hearing, tympanic membrane, ear canal and external ear normal. Tympanic membrane is not erythematous, retracted or bulging.     Nose: No mucosal edema or rhinorrhea.     Right Sinus: No maxillary sinus tenderness or frontal sinus tenderness.     Left Sinus: No maxillary sinus tenderness or frontal sinus tenderness.     Mouth/Throat:     Pharynx: Uvula midline.  Eyes:     General: Lids are normal. Lids are everted, no foreign bodies appreciated.     Conjunctiva/sclera: Conjunctivae normal.     Pupils: Pupils are equal, round, and reactive to light.  Neck:     Musculoskeletal: Normal range of motion and neck supple.     Thyroid: No thyroid mass or thyromegaly.     Vascular: No carotid bruit.     Trachea: Trachea normal.  Cardiovascular:     Rate and Rhythm: Normal rate and regular rhythm.     Pulses: Normal pulses.     Heart sounds: Normal heart sounds, S1 normal and S2 normal. No murmur. No friction rub. No gallop.   Pulmonary:     Effort: Pulmonary effort is normal. No tachypnea or respiratory distress.     Breath sounds: Normal breath sounds. No decreased breath sounds, wheezing, rhonchi or rales.  Abdominal:     General: Bowel sounds are normal.     Palpations: Abdomen is soft.     Tenderness: There is  no abdominal tenderness.  Musculoskeletal:       Arms:     Comments: Mild ttp in trapezius and bilateral  Upper and lower arms.. no focal ttp, no joint redness, swelling or pain.   Mild soreness to palpation in upper chest.  Skin:    General: Skin is warm and dry.     Findings: No rash.  Neurological:     Mental Status: She is alert.  Psychiatric:        Mood and Affect: Mood is not anxious or depressed.        Speech: Speech normal.        Behavior: Behavior normal. Behavior is cooperative.        Thought Content: Thought content normal.        Judgment: Judgment normal.           Assessment & Plan:

## 2018-03-27 NOTE — Telephone Encounter (Signed)
Pt said 1 wk lt arm pain on and off; hurts worse with movement; this morning had mild CP on and off with no radiation of pain. Pt has had CP before. No SOB, N&V or sweats. Pt is presently working cleaning houses. Pt said pain is not severe but thought should get it checked out. Pt does not want to go to UC or ED now. Pt scheduled appt with Dr Diona Browner 03/27/18 at 12 noon. If pt condition changes or worsens prior to appt pt will go to ED. FYI to Dr Diona Browner.

## 2018-03-27 NOTE — Assessment & Plan Note (Signed)
EKG: Normal sinus rhythm. Normal axis, normal R wave progression, No acute ST elevation or depression. Most likely MSK pain vs GI irritation.  Treat with stretching and  Avoidance of reflux triggers. Can start PPI if needed for 2 weeks.

## 2018-05-02 NOTE — Telephone Encounter (Signed)
Pt seen 03/27/18.

## 2018-05-23 ENCOUNTER — Encounter: Payer: Self-pay | Admitting: *Deleted

## 2018-07-14 ENCOUNTER — Other Ambulatory Visit: Payer: Self-pay | Admitting: Family Medicine

## 2018-07-16 ENCOUNTER — Other Ambulatory Visit: Payer: Self-pay | Admitting: Obstetrics & Gynecology

## 2018-07-16 DIAGNOSIS — Z1231 Encounter for screening mammogram for malignant neoplasm of breast: Secondary | ICD-10-CM

## 2018-07-24 ENCOUNTER — Telehealth: Payer: Self-pay | Admitting: *Deleted

## 2018-07-24 ENCOUNTER — Other Ambulatory Visit: Payer: BLUE CROSS/BLUE SHIELD

## 2018-07-24 DIAGNOSIS — Z20822 Contact with and (suspected) exposure to covid-19: Secondary | ICD-10-CM

## 2018-07-24 DIAGNOSIS — R6889 Other general symptoms and signs: Secondary | ICD-10-CM | POA: Diagnosis not present

## 2018-07-24 NOTE — Telephone Encounter (Signed)
Scheduled patient for COVID 19 test today at [facility] at 2:15pm.  Testing protocol reviewed with patient.

## 2018-07-24 NOTE — Telephone Encounter (Signed)
-----   Message from Jinny Sanders, MD sent at 07/23/2018  1:10 PM EDT ----- Janet Porter Mar 19, 1965 [id number] Re: Exposure to large group event/airport.. currently asymptomatic [employer] ID [id number]

## 2018-07-28 LAB — NOVEL CORONAVIRUS, NAA: SARS-CoV-2, NAA: NOT DETECTED

## 2018-08-24 ENCOUNTER — Other Ambulatory Visit: Payer: Self-pay

## 2018-08-24 ENCOUNTER — Ambulatory Visit
Admission: RE | Admit: 2018-08-24 | Discharge: 2018-08-24 | Disposition: A | Discharge status: Home / Self Care | Payer: BC Managed Care – PPO | Source: Ambulatory Visit | Attending: Obstetrics & Gynecology | Admitting: Obstetrics & Gynecology

## 2018-08-24 DIAGNOSIS — Z1231 Encounter for screening mammogram for malignant neoplasm of breast: Secondary | ICD-10-CM

## 2018-11-11 ENCOUNTER — Other Ambulatory Visit: Payer: Self-pay

## 2018-11-11 ENCOUNTER — Emergency Department (HOSPITAL_COMMUNITY)
Admission: EM | Admit: 2018-11-11 | Discharge: 2018-11-12 | Disposition: A | Discharge status: Home / Self Care | Payer: BC Managed Care – PPO | Attending: Emergency Medicine | Admitting: Emergency Medicine

## 2018-11-11 ENCOUNTER — Emergency Department (HOSPITAL_COMMUNITY): Payer: BC Managed Care – PPO

## 2018-11-11 ENCOUNTER — Encounter (HOSPITAL_COMMUNITY): Payer: Self-pay | Admitting: Emergency Medicine

## 2018-11-11 DIAGNOSIS — Z79899 Other long term (current) drug therapy: Secondary | ICD-10-CM | POA: Diagnosis not present

## 2018-11-11 DIAGNOSIS — R079 Chest pain, unspecified: Secondary | ICD-10-CM | POA: Diagnosis not present

## 2018-11-11 DIAGNOSIS — R0789 Other chest pain: Secondary | ICD-10-CM | POA: Diagnosis not present

## 2018-11-11 LAB — CBC
HCT: 41.9 % (ref 36.0–46.0)
Hemoglobin: 14.6 g/dL (ref 12.0–15.0)
MCH: 31.7 pg (ref 26.0–34.0)
MCHC: 34.8 g/dL (ref 30.0–36.0)
MCV: 90.9 fL (ref 80.0–100.0)
Platelets: 235 10*3/uL (ref 150–400)
RBC: 4.61 MIL/uL (ref 3.87–5.11)
RDW: 12.4 % (ref 11.5–15.5)
WBC: 9.4 10*3/uL (ref 4.0–10.5)
nRBC: 0 % (ref 0.0–0.2)

## 2018-11-11 LAB — BASIC METABOLIC PANEL
Anion gap: 12 (ref 5–15)
BUN: 14 mg/dL (ref 6–20)
CO2: 22 mmol/L (ref 22–32)
Calcium: 8.7 mg/dL — ABNORMAL LOW (ref 8.9–10.3)
Chloride: 102 mmol/L (ref 98–111)
Creatinine, Ser: 0.76 mg/dL (ref 0.44–1.00)
GFR calc Af Amer: >60 mL/min (ref >60)
GFR calc non Af Amer: >60 mL/min (ref >60)
Glucose, Bld: 141 mg/dL — ABNORMAL HIGH (ref 70–99)
Potassium: 3.6 mmol/L (ref 3.5–5.1)
Sodium: 136 mmol/L (ref 135–145)

## 2018-11-11 LAB — I-STAT BETA HCG BLOOD, ED (MC, WL, AP ONLY): I-stat hCG, quantitative: <5 m[IU]/mL (ref <5)

## 2018-11-11 LAB — TROPONIN I (HIGH SENSITIVITY): Troponin I (High Sensitivity): 4 ng/L (ref <18)

## 2018-11-11 NOTE — ED Triage Notes (Signed)
Patient c/o mid chest pain radiating into left side onset of earlier this afternoon. Describes pain as a dull pressure with mild nausea.

## 2018-11-12 LAB — TROPONIN I (HIGH SENSITIVITY): Troponin I (High Sensitivity): <2 ng/L (ref <18)

## 2018-11-12 NOTE — ED Provider Notes (Signed)
[facility] Provider Note   CSN: [id number] Arrival date & time: 11/11/18  1859    History   Chief Complaint Chief Complaint  Patient presents with  . Chest Pain    HPI Janet Porter is a 53 y.o. female.   The history is provided by the patient.  Chest Pain She has history of hyperlipidemia and prediabetes and comes in with an episode of chest pain which started about 4:30 PM.  She describes it felt like she was punched in the middle of her chest and there was some radiation to the left shoulder.  There is no associated dyspnea or diaphoresis but there was some mild nausea.  Nothing made the pain better, nothing made it worse.  She did not try to take anything to help it but she did take aspirin 81 mg.  Since arriving in the ED, pain has resolved and was present for approximately 8-9 hours.  She has had chest pain in the past and thinks this felt somewhat similar.  She is a non-smoker and denies history of hypertension with is no family history of premature coronary atherosclerosis.  She has noted some inflammation around her breastbone intermittently over the last year.  Past Medical History:  Diagnosis Date  . Abnormal Pap smear 2005  . Anxiety state, unspecified   . Benign neoplasm of skin, site unspecified   . Cancer ([employer])    skin cancer   . Depressive disorder, not elsewhere classified   . Esophageal reflux    pt does not take meds  . Headache(784.0)   . 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 Pure hypercholesterolemia   . Unspecified sinusitis (chronic)     Patient Active Problem List   Diagnosis Date Noted  . Arm pain, diffuse, left 03/27/2018  . Chest pain 03/27/2018  . Poison ivy dermatitis 06/22/2016  . Abrasion of knee, left 04/07/2015  . Calcific tendonitis of left lower leg 12/03/2013  . Elevated liver function  tests 10/18/2013  . Prediabetes 10/18/2013  . DUB (dysfunctional uterine bleeding) 07/13/2012  . Perimenopausal vasomotor symptoms 05/03/2011  . PALPITATIONS 09/01/2009  . Generalized anxiety disorder 03/21/2007  . TB, RESPIRATORY NEC, HISTOLOGIC DX 09/22/2006  . HYPERCHOLESTEROLEMIA 09/22/2006  . GERD 09/22/2006    Past Surgical History:  Procedure Laterality Date  . ADENOIDECTOMY    . COLPOSCOPY  2000   abn pap  . DILITATION & CURRETTAGE/HYSTROSCOPY WITH NOVASURE ABLATION N/A 07/13/2012   Procedure: DILATATION & CURETTAGE/HYSTEROSCOPY WITH NOVASURE ABLATION;  Surgeon: Emily Filbert, MD;  Location: [facility];  Service: Gynecology;  Laterality: N/A;  . TONSILLECTOMY  1979  . TONSILLECTOMY    . VARICOSE VEIN SURGERY  2000  . VARICOSE VEIN SURGERY       OB History    Gravida  3   Para  3   Term      Preterm      AB      Living  3     SAB      TAB      Ectopic      Multiple      Live Births               Home Medications    Prior to Admission medications   Medication Sig Start Date End Date Taking? Authorizing Provider  citalopram (CELEXA) 20 MG tablet TAKE 1 TABLET BY MOUTH EVERY DAY  07/14/18   [name], [name], MD  simvastatin (ZOCOR) 20 MG tablet TAKE 1 TABLET BY MOUTH EVERYDAY AT BEDTIME 11/30/17   [name], [name], MD    Family History Family History  Problem Relation Age of Onset  . Hypertension Father   . Diabetes Father   . Hyperlipidemia Father   . Healthy Mother   . Colon polyps Mother   . Healthy Brother   . Pancreatic cancer Paternal Grandfather   . Cancer Maternal Grandfather        Oral  . Coronary artery disease Maternal Grandmother        Stents  . Colon cancer Neg Hx   . Esophageal cancer Neg Hx   . Rectal cancer Neg Hx   . Stomach cancer Neg Hx     Social History Social History   Tobacco Use  . Smoking status: Never Smoker  . Smokeless tobacco: Never Used  Substance Use Topics  . Alcohol use: No  . Drug use: No      Allergies   Patient has no known allergies.   Review of Systems Review of Systems  Cardiovascular: Positive for chest pain.  All other systems reviewed and are negative.    Physical Exam Updated Vital Signs BP (!) 154/89 (BP Location: Left Arm)   Pulse 60   Temp 97.7 F (36.5 C) (Oral)   Resp 18   Ht 5\' 3"  (1.6 m)   Wt 77.1 kg   SpO2 99%   BMI 30.11 kg/m   Physical Exam Vitals signs and nursing note reviewed.    53 year old female, resting comfortably and in no acute distress. Vital signs are significant for elevated blood pressure. Oxygen saturation is 99%, which is normal. Head is normocephalic and atraumatic. PERRLA, EOMI. Oropharynx is clear. Neck is nontender and supple without adenopathy or JVD. Back is nontender and there is no CVA tenderness. Lungs are clear without rales, wheezes, or rhonchi. Chest has mild tenderness in the parasternal area which does not reproduce her pain. Heart has regular rate and rhythm without murmur. Abdomen is soft, flat, nontender without masses or hepatosplenomegaly and peristalsis is normoactive. Extremities have no cyanosis or edema, full range of motion is present. Skin is warm and dry without rash. Neurologic: Mental status is normal, cranial nerves are intact, there are no motor or sensory deficits.  ED Treatments / Results  Labs (all labs ordered are listed, but only abnormal results are displayed) Labs Reviewed  BASIC METABOLIC PANEL - Abnormal; Notable for the following components:      Result Value   Glucose, Bld 141 (*)    Calcium 8.7 (*)    All other components within normal limits  CBC  I-STAT BETA HCG BLOOD, ED ([facility], [facility], [facility] ONLY)  TROPONIN I (HIGH SENSITIVITY)  TROPONIN I (HIGH SENSITIVITY)    EKG EKG Interpretation  Date/Time:  Sunday November 11 2018 19:06:36 EDT Ventricular Rate:  87 PR Interval:  150 QRS Duration: 86 QT Interval:  378 QTC Calculation: 454 R Axis:   58 Text Interpretation:  Normal  sinus rhythm Normal ECG No old tracing to compare Confirmed by Delora Fuel ([id number]) on 11/11/2018 11:10:34 PM   Radiology Dg Chest 2 View  Result Date: 11/11/2018 CLINICAL DATA:  Chest pain EXAM: CHEST - 2 VIEW COMPARISON:  None. FINDINGS: The heart size and mediastinal contours are within normal limits. Both lungs are clear. The visualized skeletal structures are unremarkable. IMPRESSION: No active cardiopulmonary disease. Electronically Signed  By: Inez Catalina M.D.   On: 11/11/2018 19:58    Procedures Procedures   Medications Ordered in ED Medications - No data to display   Initial Impression / Assessment and Plan / ED Course  I have reviewed the triage vital signs and the nursing notes.  Pertinent labs & imaging results that were available during my care of the patient were reviewed by me and considered in my medical decision making (see chart for details).  Chest pain which seems very atypical for coronary disease.  ECG is normal and chest x-ray is unremarkable.  Initial troponin is normal.  Glucose is mildly elevated consistent with known history of prediabetes.  Blood pressure here is mildly elevated and is felt most likely related to stress although this will need to be followed.  Heart score is 2 which puts her at low risk for major adverse cardiac events in the next 6 weeks.  She is currently symptom-free.  Awaiting repeat troponin.  Old records are reviewed confirming office evaluation for atypical chest pain on March 27, 2018.  Repeat troponin is normal.  Blood pressure has come down.  She is discharged with instructions to follow-up with PCP, return precautions discussed.  Final Clinical Impressions(s) / ED Diagnoses   Final diagnoses:  Atypical chest pain    ED Discharge Orders    None       Delora Fuel, MD [id number] [phone]

## 2018-11-12 NOTE — Discharge Instructions (Addendum)
Your evaluation tonight did not show any sign of any heart problem.  However, your blood sugar was a little elevated.  Please follow-up with your primary care provider to continue monitoring your blood sugar, and to decide if any additional testing is needed regarding your chest pain.

## 2018-11-15 ENCOUNTER — Other Ambulatory Visit: Payer: Self-pay

## 2018-11-15 ENCOUNTER — Encounter: Payer: Self-pay | Admitting: Family Medicine

## 2018-11-15 ENCOUNTER — Ambulatory Visit: Payer: BC Managed Care – PPO | Admitting: Family Medicine

## 2018-11-15 VITALS — BP 120/80 | HR 77 | Temp 98.8°F | Ht 63.5 in | Wt 179.0 lb

## 2018-11-15 DIAGNOSIS — R079 Chest pain, unspecified: Secondary | ICD-10-CM

## 2018-11-15 DIAGNOSIS — Z8249 Family history of ischemic heart disease and other diseases of the circulatory system: Secondary | ICD-10-CM

## 2018-11-15 NOTE — Progress Notes (Signed)
Chief Complaint  Patient presents with  . Follow-up    [facility] ER-Atypical CP    History of Present Illness: HPI   53 year old female with high chol and prediabetes presents for ER follow up for atypical chest pain on 11/11/2018 She describes it felt like she was punched in the middle of her chest and there was some radiation to the left shoulder.  There is no associated dyspnea or diaphoresis but there was some mild nausea.  Nothing made the pain better, nothing made it worse.  She did not try to take anything to help it but she did take aspirin 81 mg.  BP Readings from Last 3 Encounters:  11/15/18 120/80  11/12/18 (!) 155/82  03/27/18 126/88   IN ER:  Felt likely atypical chest pain, non cardiopulmonary. ECG is normal and chest x-ray is unremarkable.  troponin x 2 are normal.  Glucose is mildly elevated consistent with known history of prediabetes.  Blood pressure here is mildly elevated and is felt most likely related to stress although this will need to be followed.  Heart score is 2 which puts her at low risk for major adverse cardiac events in the next 6 weeks.    She has had some milder pain  off and on since. Chest pain is nonexeritonal, no change with eating, no  change with deep breath.  No pain with palpation.  NO SOB, occ palpitations. Every few days.   Frequent heart burn but this felt different.. using tums  Mother  And MGM, aunt with aortic aneurysm. She wishes to be evaluated for this.  COVID 19 screen No recent travel or known exposure to COVID19 The patient denies respiratory symptoms of COVID 19 at this time.  The importance of social distancing was discussed today.   Review of Systems  Constitutional: Negative for chills and fever.  HENT: Negative for congestion and ear pain.   Eyes: Negative for pain and redness.  Respiratory: Negative for cough and shortness of breath.   Cardiovascular: Positive for chest pain and palpitations. Negative for leg swelling.   Gastrointestinal: Negative for abdominal pain, blood in stool, constipation, diarrhea, nausea and vomiting.  Genitourinary: Negative for dysuria.  Musculoskeletal: Negative for falls and myalgias.  Skin: Negative for rash.  Neurological: Negative for dizziness.  Psychiatric/Behavioral: Negative for depression. The patient is not nervous/anxious.       Past Medical History:  Diagnosis Date  . Abnormal Pap smear 2005  . Anxiety state, unspecified   . Benign neoplasm of skin, site unspecified   . Cancer ([place])    skin cancer   . Depressive disorder, not elsewhere classified   . Esophageal reflux    pt does not take meds  . Headache(784.0)   . 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 Pure hypercholesterolemia   . Unspecified sinusitis (chronic)     reports that she has never smoked. She has never used smokeless tobacco. She reports that she does not drink alcohol or use drugs.   Current Outpatient Medications:  .  citalopram (CELEXA) 20 MG tablet, TAKE 1 TABLET BY MOUTH EVERY DAY, Disp: 90 tablet, Rfl: 1 .  simvastatin (ZOCOR) 20 MG tablet, TAKE 1 TABLET BY MOUTH EVERYDAY AT BEDTIME, Disp: 90 tablet, Rfl: 3   Observations/Objective: Blood pressure 120/80, pulse 77, temperature 98.8 F (37.1 C), temperature source Temporal, height 5' 3.5" (1.613 m), weight 179 lb (81.2 kg), SpO2 94 %.  Physical Exam Constitutional:      General: She is not in acute distress.    Appearance: Normal appearance. She is well-developed. She is not ill-appearing or toxic-appearing.  HENT:     Head: Normocephalic.     Right Ear: Hearing, tympanic membrane, ear canal and external ear normal. Tympanic membrane is not erythematous, retracted or bulging.     Left Ear: Hearing, tympanic membrane, ear canal and external ear normal. Tympanic membrane is not erythematous, retracted or bulging.     Nose: No  mucosal edema or rhinorrhea.     Right Sinus: No maxillary sinus tenderness or frontal sinus tenderness.     Left Sinus: No maxillary sinus tenderness or frontal sinus tenderness.     Mouth/Throat:     Pharynx: Uvula midline.  Eyes:     General: Lids are normal. Lids are everted, no foreign bodies appreciated.     Conjunctiva/sclera: Conjunctivae normal.     Pupils: Pupils are equal, round, and reactive to light.  Neck:     Musculoskeletal: Normal range of motion and neck supple.     Thyroid: No thyroid mass or thyromegaly.     Vascular: No carotid bruit.     Trachea: Trachea normal.  Cardiovascular:     Rate and Rhythm: Normal rate and regular rhythm.     Pulses: Normal pulses.     Heart sounds: Normal heart sounds, S1 normal and S2 normal. No murmur. No friction rub. No gallop.   Pulmonary:     Effort: Pulmonary effort is normal. No tachypnea or respiratory distress.     Breath sounds: Normal breath sounds. No decreased breath sounds, wheezing, rhonchi or rales.  Chest:     Chest wall: Tenderness present. No mass.  Abdominal:     General: Bowel sounds are normal.     Palpations: Abdomen is soft.     Tenderness: There is no abdominal tenderness.  Skin:    General: Skin is warm and dry.     Findings: No rash.  Neurological:     Mental Status: She is alert.  Psychiatric:        Mood and Affect: Mood is not anxious or depressed.        Speech: Speech normal.        Behavior: Behavior normal. Behavior is cooperative.        Thought Content: Thought content normal.        Judgment: Judgment normal.      Assessment and Plan Family history of thoracic aortic aneurysm Further eval needed given strong family history.  Chest pain  GERD vs MSK pain Avoid triggers  For reflux.. if not improving consider daily Prilosec daily   Start chest wall  Stretching. When no reflux can try a course of ibuprofen .        Eliezer Lofts, MD

## 2018-11-15 NOTE — Patient Instructions (Addendum)
Avoid triggers  For reflux.. if not improving consider daily Prilosec daily   Start chest wall  Stretching. When no reflux can try a course of ibuprofen .  We will call to set up test for aneurysm.  Costochondritis  Costochondritis is swelling and irritation (inflammation) of the tissue (cartilage) that connects your ribs to your breastbone (sternum). This causes pain in the front of your chest. The pain usually starts gradually and involves more than one rib. What are the causes? The exact cause of this condition is not always known. It results from stress on the cartilage where your ribs attach to your sternum. The cause of this stress could be:  Chest injury (trauma).  Exercise or activity, such as lifting.  Severe coughing. What increases the risk? You may be at higher risk for this condition if you:  Are female.  Are 49?51 years old.  Recently started a new exercise or work activity.  Have low levels of vitamin D.  Have a condition that makes you cough frequently. What are the signs or symptoms? The main symptom of this condition is chest pain. The pain:  Usually starts gradually and can be sharp or dull.  Gets worse with deep breathing, coughing, or exercise.  Gets better with rest.  May be worse when you press on the sternum-rib connection (tenderness). How is this diagnosed? This condition is diagnosed based on your symptoms, medical history, and a physical exam. Your health care provider will check for tenderness when pressing on your sternum. This is the most important finding. You may also have tests to rule out other causes of chest pain. These may include:  A chest X-ray to check for lung problems.  An electrocardiogram (ECG) to see if you have a heart problem that could be causing the pain.  An imaging scan to rule out a chest or rib fracture. How is this treated? This condition usually goes away on its own over time. Your health care provider may  prescribe an NSAID to reduce pain and inflammation. Your health care provider may also suggest that you:  Rest and avoid activities that make pain worse.  Apply heat or cold to the area to reduce pain and inflammation.  Do exercises to stretch your chest muscles. If these treatments do not help, your health care provider may inject a numbing medicine at the sternum-rib connection to help relieve the pain. Follow these instructions at home:  Avoid activities that make pain worse. This includes any activities that use chest, abdominal, and side muscles.  If directed, put ice on the painful area: ? Put ice in a plastic bag. ? Place a towel between your skin and the bag. ? Leave the ice on for 20 minutes, 2-3 times a day.  If directed, apply heat to the affected area as often as told by your health care provider. Use the heat source that your health care provider recommends, such as a moist heat pack or a heating pad. ? Place a towel between your skin and the heat source. ? Leave the heat on for 20-30 minutes. ? Remove the heat if your skin turns bright red. This is especially important if you are unable to feel pain, heat, or cold. You may have a greater risk of getting burned.  Take over-the-counter and prescription medicines only as told by your health care provider.  Return to your normal activities as told by your health care provider. Ask your health care provider what activities are safe  for you.  Keep all follow-up visits as told by your health care provider. This is important. Contact a health care provider if:  You have chills or a fever.  Your pain does not go away or it gets worse.  You have a cough that does not go away (is persistent). Get help right away if:  You have shortness of breath. This information is not intended to replace advice given to you by your health care provider. Make sure you discuss any questions you have with your health care provider. Document  Released: 10/13/2004 Document Revised: 01/18/2017 Document Reviewed: 04/29/2015 Elsevier Patient Education  2020 [employer].

## 2018-11-16 NOTE — Telephone Encounter (Signed)
The [place] office doesn't have an echocardiogram appt until first week of December.  I'll need to schedule you in [place] instead ok?

## 2018-11-19 ENCOUNTER — Ambulatory Visit (HOSPITAL_COMMUNITY): Payer: BC Managed Care – PPO

## 2018-11-19 ENCOUNTER — Other Ambulatory Visit (HOSPITAL_COMMUNITY): Payer: BC Managed Care – PPO

## 2018-11-19 ENCOUNTER — Encounter (HOSPITAL_COMMUNITY): Payer: Self-pay

## 2018-11-22 ENCOUNTER — Other Ambulatory Visit: Payer: Self-pay

## 2018-11-22 ENCOUNTER — Ambulatory Visit (INDEPENDENT_AMBULATORY_CARE_PROVIDER_SITE_OTHER): Payer: BC Managed Care – PPO

## 2018-11-22 DIAGNOSIS — Z8249 Family history of ischemic heart disease and other diseases of the circulatory system: Secondary | ICD-10-CM

## 2018-11-29 ENCOUNTER — Other Ambulatory Visit: Payer: BC Managed Care – PPO

## 2018-12-10 ENCOUNTER — Telehealth: Payer: Self-pay | Admitting: Family Medicine

## 2018-12-10 DIAGNOSIS — R7303 Prediabetes: Secondary | ICD-10-CM

## 2018-12-10 DIAGNOSIS — E7800 Pure hypercholesterolemia, unspecified: Secondary | ICD-10-CM

## 2018-12-10 DIAGNOSIS — Z8249 Family history of ischemic heart disease and other diseases of the circulatory system: Secondary | ICD-10-CM | POA: Insufficient documentation

## 2018-12-10 NOTE — Assessment & Plan Note (Signed)
GERD vs MSK pain Avoid triggers  For reflux.. if not improving consider daily Prilosec daily   Start chest wall  Stretching. When no reflux can try a course of ibuprofen .

## 2018-12-10 NOTE — Telephone Encounter (Signed)
-----   Message from Ellamae Sia sent at 12/03/2018 10:50 AM EST ----- Regarding: Lab orders for Tuesday, 11.24.20 Patient is scheduled for CPX labs, please order future labs, Thanks , Karna Christmas

## 2018-12-10 NOTE — Assessment & Plan Note (Signed)
Further eval needed given strong family history.

## 2018-12-11 ENCOUNTER — Other Ambulatory Visit: Payer: Self-pay

## 2018-12-11 ENCOUNTER — Other Ambulatory Visit (INDEPENDENT_AMBULATORY_CARE_PROVIDER_SITE_OTHER): Payer: BC Managed Care – PPO

## 2018-12-11 DIAGNOSIS — R7303 Prediabetes: Secondary | ICD-10-CM

## 2018-12-11 DIAGNOSIS — E7800 Pure hypercholesterolemia, unspecified: Secondary | ICD-10-CM

## 2018-12-11 LAB — COMPREHENSIVE METABOLIC PANEL
ALT: 53 U/L — ABNORMAL HIGH (ref 0–35)
AST: 37 U/L (ref 0–37)
Albumin: 4.3 g/dL (ref 3.5–5.2)
Alkaline Phosphatase: 78 U/L (ref 39–117)
BUN: 15 mg/dL (ref 6–23)
CO2: 31 mEq/L (ref 19–32)
Calcium: 9.4 mg/dL (ref 8.4–10.5)
Chloride: 102 mEq/L (ref 96–112)
Creatinine, Ser: 0.74 mg/dL (ref 0.40–1.20)
GFR: 82.69 mL/min (ref >60.00)
Glucose, Bld: 112 mg/dL — ABNORMAL HIGH (ref 70–99)
Potassium: 4.1 mEq/L (ref 3.5–5.1)
Sodium: 138 mEq/L (ref 135–145)
Total Bilirubin: 1.1 mg/dL (ref 0.2–1.2)
Total Protein: 6.9 g/dL (ref 6.0–8.3)

## 2018-12-11 LAB — LIPID PANEL
Cholesterol: 183 mg/dL (ref 0–200)
HDL: 39.9 mg/dL (ref >39.00)
NonHDL: 143.24
Total CHOL/HDL Ratio: 5
Triglycerides: 241 mg/dL — ABNORMAL HIGH (ref 0.0–149.0)
VLDL: 48.2 mg/dL — ABNORMAL HIGH (ref 0.0–40.0)

## 2018-12-11 LAB — LDL CHOLESTEROL, DIRECT: Direct LDL: 96 mg/dL

## 2018-12-11 LAB — HEMOGLOBIN A1C: Hgb A1c MFr Bld: 5.3 % (ref 4.6–6.5)

## 2018-12-18 ENCOUNTER — Other Ambulatory Visit: Payer: Self-pay

## 2018-12-18 ENCOUNTER — Ambulatory Visit (INDEPENDENT_AMBULATORY_CARE_PROVIDER_SITE_OTHER): Payer: BC Managed Care – PPO | Admitting: Family Medicine

## 2018-12-18 ENCOUNTER — Encounter: Payer: Self-pay | Admitting: Family Medicine

## 2018-12-18 VITALS — BP 130/84 | HR 79 | Temp 98.2°F | Ht 63.25 in | Wt 178.0 lb

## 2018-12-18 DIAGNOSIS — E7800 Pure hypercholesterolemia, unspecified: Secondary | ICD-10-CM | POA: Diagnosis not present

## 2018-12-18 DIAGNOSIS — F411 Generalized anxiety disorder: Secondary | ICD-10-CM

## 2018-12-18 DIAGNOSIS — K760 Fatty (change of) liver, not elsewhere classified: Secondary | ICD-10-CM

## 2018-12-18 DIAGNOSIS — Z0000 Encounter for general adult medical examination without abnormal findings: Secondary | ICD-10-CM | POA: Diagnosis not present

## 2018-12-18 NOTE — Patient Instructions (Signed)
Work on low carb, low fat diet and increase exercise.

## 2018-12-18 NOTE — Assessment & Plan Note (Signed)
Well controlled. Continue current medication.  

## 2018-12-18 NOTE — Progress Notes (Signed)
Chief Complaint  Patient presents with  . Annual Exam    History of Present Illness: HPI  The patient is here for annual wellness exam and preventative care.    Elevated Cholesterol:  LDL well controlled on simvastatin. Lab Results  Component Value Date   CHOL 183 12/11/2018   HDL 39.90 12/11/2018   LDLCALC 102 (H) 11/28/2017   LDLDIRECT 96.0 12/11/2018   TRIG 241.0 (H) 12/11/2018   CHOLHDL 5 12/11/2018  Using medications without problems: Muscle aches:  Diet compliance:good Exercise:walking, cleaning houses Other complaints: Body mass index is 31.28 kg/m.  Family history of TAA: Recent ECHO unremarkable, nml EF , nml aorta  Prediabetes  Resolved. Lab Results  Component Value Date   HGBA1C 5.3 12/11/2018   GAD : Stable on celexa   Office Visit from 12/18/2018 in [place] at [facility] Total Score  0     ALT increased: in past eval with US/MRI: Hemangioma, and fatty liver.  No ETOH/tyelnol,   Sees GYN: Dr. Hulan Fray.   This visit occurred during the SARS-CoV-2 public health emergency.  Safety protocols were in place, including screening questions prior to the visit, additional usage of staff PPE, and extensive cleaning of exam room while observing appropriate contact time as indicated for disinfecting solutions.   COVID 19 screen:  No recent travel or known exposure to COVID19 The patient denies respiratory symptoms of COVID 19 at this time. The importance of social distancing was discussed today.     Review of Systems  Constitutional: Negative for chills and fever.  HENT: Negative for congestion and ear pain.   Eyes: Negative for pain and redness.  Respiratory: Negative for cough and shortness of breath.   Cardiovascular: Negative for chest pain, palpitations and leg swelling.  Gastrointestinal: Negative for abdominal pain, blood in stool, constipation, diarrhea, nausea and vomiting.  Genitourinary: Negative for dysuria.  Musculoskeletal:  Negative for falls and myalgias.  Skin: Negative for rash.  Neurological: Negative for dizziness.  Psychiatric/Behavioral: Negative for depression. The patient is not nervous/anxious.       Past Medical History:  Diagnosis Date  . Abnormal Pap smear 2005  . Anxiety state, unspecified   . Benign neoplasm of skin, site unspecified   . Cancer ([place])    skin cancer   . Depressive disorder, not elsewhere classified   . Esophageal reflux    pt does not take meds  . Headache(784.0)   . 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 Pure hypercholesterolemia   . Unspecified sinusitis (chronic)     reports that she has never smoked. She has never used smokeless tobacco. She reports that she does not drink alcohol or use drugs.   Current Outpatient Medications:  .  citalopram (CELEXA) 20 MG tablet, TAKE 1 TABLET BY MOUTH EVERY DAY, Disp: 90 tablet, Rfl: 1 .  simvastatin (ZOCOR) 20 MG tablet, TAKE 1 TABLET BY MOUTH EVERYDAY AT BEDTIME, Disp: 90 tablet, Rfl: 3   Observations/Objective: Blood pressure 130/84, pulse 79, temperature 98.2 F (36.8 C), temperature source Temporal, height 5' 3.25" (1.607 m), weight 178 lb (80.7 kg), SpO2 93 %.  Physical Exam Constitutional:      General: She is not in acute distress.    Appearance: Normal appearance. She is well-developed. She is not ill-appearing or toxic-appearing.  HENT:     Head: Normocephalic.     Right Ear: Hearing, tympanic membrane, ear canal and external  ear normal.     Left Ear: Hearing, tympanic membrane, ear canal and external ear normal.     Nose: Nose normal.  Eyes:     General: Lids are normal. Lids are everted, no foreign bodies appreciated.     Conjunctiva/sclera: Conjunctivae normal.     Pupils: Pupils are equal, round, and reactive to light.  Neck:     Musculoskeletal: Normal range of motion and neck supple.     Thyroid:  No thyroid mass or thyromegaly.     Vascular: No carotid bruit.     Trachea: Trachea normal.  Cardiovascular:     Rate and Rhythm: Normal rate and regular rhythm.     Heart sounds: Normal heart sounds, S1 normal and S2 normal. No murmur. No gallop.   Pulmonary:     Effort: Pulmonary effort is normal. No respiratory distress.     Breath sounds: Normal breath sounds. No wheezing, rhonchi or rales.  Abdominal:     General: Bowel sounds are normal. There is no distension or abdominal bruit.     Palpations: Abdomen is soft. There is no fluid wave or mass.     Tenderness: There is no abdominal tenderness. There is no guarding or rebound.     Hernia: No hernia is present.  Lymphadenopathy:     Cervical: No cervical adenopathy.  Skin:    General: Skin is warm and dry.     Findings: No rash.  Neurological:     Mental Status: She is alert.     Cranial Nerves: No cranial nerve deficit.     Sensory: No sensory deficit.  Psychiatric:        Mood and Affect: Mood is not anxious or depressed.        Speech: Speech normal.        Behavior: Behavior normal. Behavior is cooperative.        Judgment: Judgment normal.      Assessment and Plan The patient's preventative maintenance and recommended screening tests for an annual wellness exam were reviewed in full today. Brought up to date unless services declined.  Counselled on the importance of diet, exercise, and its role in overall health and mortality. The patient's FH and SH was reviewed, including their home life, tobacco status, and drug and alcohol status.   Vaccines: Uptodate with flu and td. Pap/DVE:01/2018 with GYN, Dr. Hulan Fray Mammo:08/2018 Colon:02/16/2018 colonoscopy sessile polyp, family history, repeat 5 years per Dr. Tarri Glenn Smoking Status:none ETOH/ drug [id number] HIV screen:refused    HYPERCHOLESTEROLEMIA Well controlled. Continue current medication.   Fatty liver ALT increased: in past eval with US/MRI:  Hemangioma, and fatty liver. Encouraged exercise, weight loss, healthy eating habits.   Generalized anxiety disorder Well controlled. Continue current medication.     Eliezer Lofts, MD

## 2018-12-18 NOTE — Progress Notes (Signed)
No critical labs need to be addressed urgently. We will discuss labs in detail at upcoming office visit.   

## 2018-12-18 NOTE — Assessment & Plan Note (Signed)
ALT increased: in past eval with US/MRI: Hemangioma, and fatty liver. Encouraged exercise, weight loss, healthy eating habits.

## 2018-12-31 ENCOUNTER — Other Ambulatory Visit: Payer: Self-pay | Admitting: Family Medicine

## 2019-01-02 ENCOUNTER — Other Ambulatory Visit: Payer: Self-pay | Admitting: Family Medicine

## 2019-01-07 ENCOUNTER — Encounter: Payer: Self-pay | Admitting: Radiology

## 2019-02-20 ENCOUNTER — Encounter: Payer: Self-pay | Admitting: Advanced Practice Midwife

## 2019-02-20 ENCOUNTER — Ambulatory Visit (INDEPENDENT_AMBULATORY_CARE_PROVIDER_SITE_OTHER): Payer: BC Managed Care – PPO | Admitting: Advanced Practice Midwife

## 2019-02-20 ENCOUNTER — Other Ambulatory Visit: Payer: Self-pay

## 2019-02-20 VITALS — BP 145/84 | HR 72 | Wt 171.0 lb

## 2019-02-20 DIAGNOSIS — Z9889 Other specified postprocedural states: Secondary | ICD-10-CM | POA: Diagnosis not present

## 2019-02-20 DIAGNOSIS — Z1151 Encounter for screening for human papillomavirus (HPV): Secondary | ICD-10-CM | POA: Diagnosis not present

## 2019-02-20 DIAGNOSIS — Z124 Encounter for screening for malignant neoplasm of cervix: Secondary | ICD-10-CM

## 2019-02-20 DIAGNOSIS — Z01419 Encounter for gynecological examination (general) (routine) without abnormal findings: Secondary | ICD-10-CM | POA: Diagnosis not present

## 2019-02-20 NOTE — Progress Notes (Signed)
Last pap 02/05/2018- normal  Mammography- 08/24/2018- normal

## 2019-02-20 NOTE — Progress Notes (Signed)
GYNECOLOGY ANNUAL PREVENTATIVE CARE ENCOUNTER NOTE  History:     Janet Porter is a 54 y.o. G3P3 female here for a routine annual gynecologic exam.  Current complaints: none.   Denies abnormal vaginal bleeding, discharge, pelvic pain, problems with intercourse or other gynecologic concerns.  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 Non-smoker.   Gynecologic History No LMP recorded. Patient has had an ablation. Contraception: perimenopausal Last Pap: 01/2018 Results were: normal with negative HPV Last mammogram: 08/2018. Results were: normal  Obstetric History OB History  Gravida Para Term Preterm AB Living  3 3       3   SAB TAB Ectopic Multiple Live Births               # Outcome Date GA Lbr Len/2nd Weight Sex Delivery Anes PTL Lv  3 Para           2 Para           1 Para             Past Medical History:  Diagnosis Date  . Abnormal Pap smear 2005  . Anxiety state, unspecified   . Benign neoplasm of skin, site unspecified   . Cancer ([place])    skin cancer   . Depressive disorder, not elsewhere classified   . Esophageal reflux    pt does not take meds  . Headache(784.0)   . 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 Pure hypercholesterolemia   . Unspecified sinusitis (chronic)     Past Surgical History:  Procedure Laterality Date  . ADENOIDECTOMY    . COLPOSCOPY  2000   abn pap  . DILITATION & CURRETTAGE/HYSTROSCOPY WITH NOVASURE ABLATION N/A 07/13/2012   Procedure: DILATATION & CURETTAGE/HYSTEROSCOPY WITH NOVASURE ABLATION;  Surgeon: Emily Filbert, MD;  Location: [facility];  Service: Gynecology;  Laterality: N/A;  . TONSILLECTOMY  1979  . TONSILLECTOMY    . VARICOSE VEIN SURGERY  2000  . VARICOSE VEIN SURGERY      Current Outpatient Medications on File Prior to Visit  Medication Sig Dispense Refill  . citalopram (CELEXA) 20 MG tablet  TAKE 1 TABLET BY MOUTH EVERY DAY 90 tablet 1  . simvastatin (ZOCOR) 20 MG tablet TAKE 1 TABLET BY MOUTH EVERYDAY AT BEDTIME 90 tablet 3   No current facility-administered medications on file prior to visit.    No Known Allergies  Social History:  reports that she has never smoked. She has never used smokeless tobacco. She reports that she does not drink alcohol or use drugs.  Family History  Problem Relation Age of Onset  . Hypertension Father   . Diabetes Father   . Hyperlipidemia Father   . Healthy Mother   . Colon polyps Mother   . Healthy Brother   . Pancreatic cancer Paternal Grandfather   . Cancer Maternal Grandfather        Oral  . Coronary artery disease Maternal Grandmother        Stents  . Colon cancer Neg Hx   . Esophageal cancer Neg Hx   . Rectal cancer Neg Hx   . Stomach cancer Neg Hx     The following portions of the patient's history were reviewed and updated as appropriate: allergies, current medications, past family history, past medical history, past social history, past surgical history and problem list.  Review of Systems Pertinent items noted in  HPI and remainder of comprehensive ROS otherwise negative.  Physical Exam:  BP (!) 145/84   Pulse 72   Wt 171 lb (77.6 kg)   BMI 30.05 kg/m  CONSTITUTIONAL: Well-developed, well-nourished female in no acute distress.  HENT:  Normocephalic, atraumatic, External right and left ear normal. Oropharynx is clear and moist EYES: Conjunctivae and EOM are normal. Pupils are equal, round, and reactive to light. No scleral icterus.  NECK: Normal range of motion, supple, no masses.  Normal thyroid.  SKIN: Skin is warm and dry. No rash noted. Not diaphoretic. No erythema. No pallor. MUSCULOSKELETAL: Normal range of motion. No tenderness.  No cyanosis, clubbing, or edema.  2+ distal pulses. NEUROLOGIC: Alert and oriented to person, place, and time. Normal reflexes, muscle tone coordination.  PSYCHIATRIC: Normal mood and  affect. Normal behavior. Normal judgment and thought content. CARDIOVASCULAR: Normal heart rate noted, regular rhythm RESPIRATORY: Clear to auscultation bilaterally. Effort and breath sounds normal, no problems with respiration noted. BREASTS: Symmetric in size. No masses, tenderness, skin changes, nipple drainage, or lymphadenopathy bilaterally. ABDOMEN: Soft, no distention noted.  No tenderness, rebound or guarding.  PELVIC: Normal appearing external genitalia and urethral meatus; normal appearing vaginal mucosa and cervix.  No abnormal discharge noted.  Pap smear obtained.  Normal uterine size, no other palpable masses, no uterine or adnexal tenderness.   Assessment and Plan:    1. Women's annual routine gynecological examination - Routine visit - Well woman blood work accomplished at outside clinic 11/2018 - Cytology - PAP( [place])  2. History of endometrial ablation   Will follow up results of pap smear and manage accordingly. Mammogram due August 2021 Routine preventative health maintenance measures emphasized. Please refer to After Visit Summary for other counseling recommendations.      Mallie Snooks, MSN, CNM Certified Nurse Midwife, [facility] for [employer], [facility] 02/20/19 4:02 PM

## 2019-02-20 NOTE — Patient Instructions (Signed)

## 2019-02-25 LAB — CYTOLOGY - PAP
Comment: NEGATIVE
Diagnosis: NEGATIVE
High risk HPV: NEGATIVE

## 2019-02-25 IMAGING — MG DIGITAL SCREENING BILATERAL MAMMOGRAM WITH TOMO AND CAD
8 series · 8 of 24 positions shown · non-contrast
Comparison: Previous exam(s).

CLINICAL DATA: Screening.

EXAM:
DIGITAL SCREENING BILATERAL MAMMOGRAM WITH TOMO AND CAD

[L MLO synth-2D]
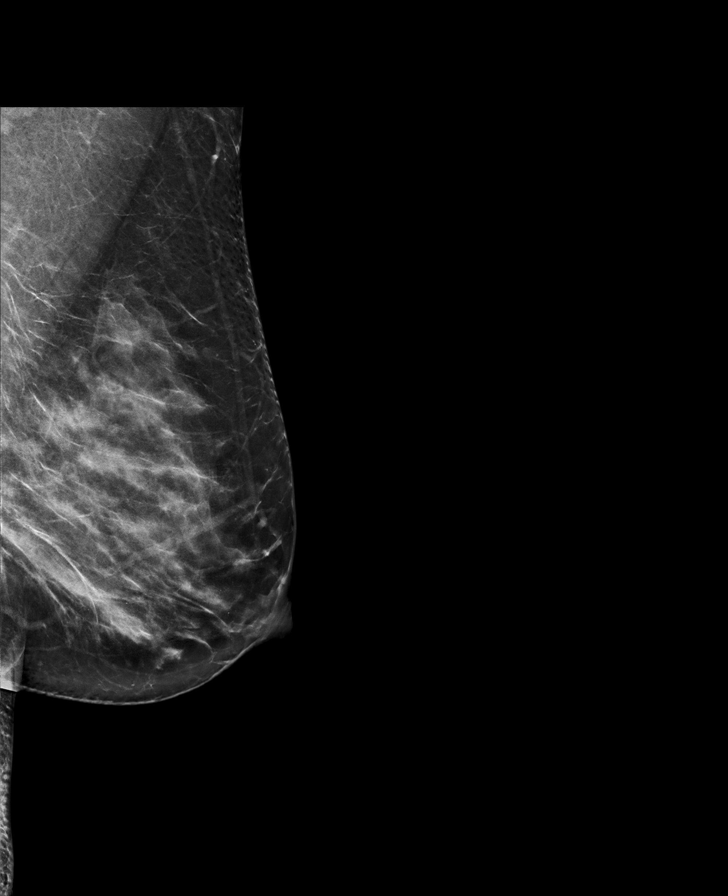

[L CC synth-2D]
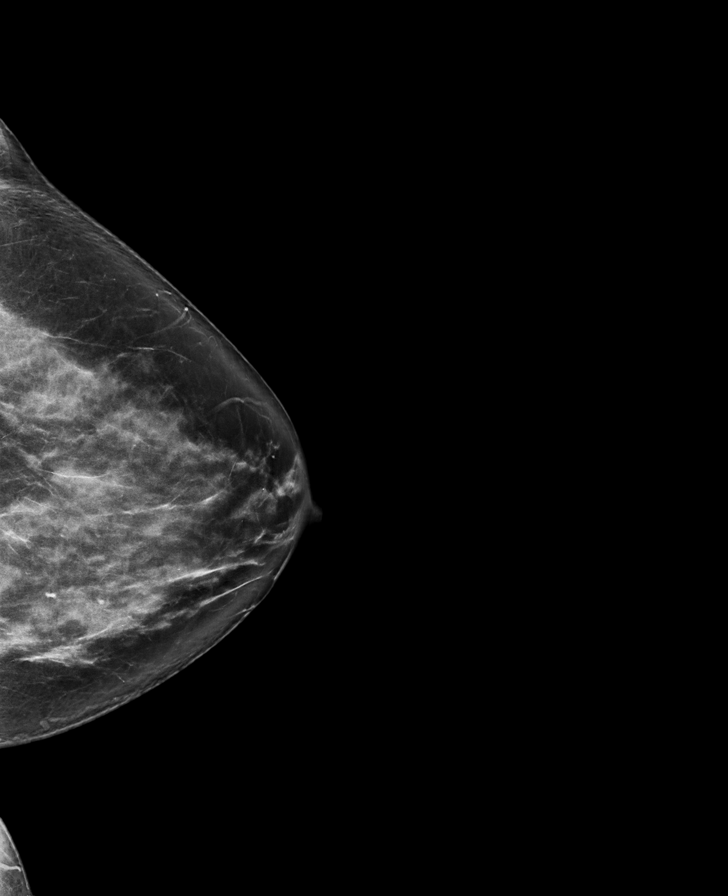

[R CC synth-2D]
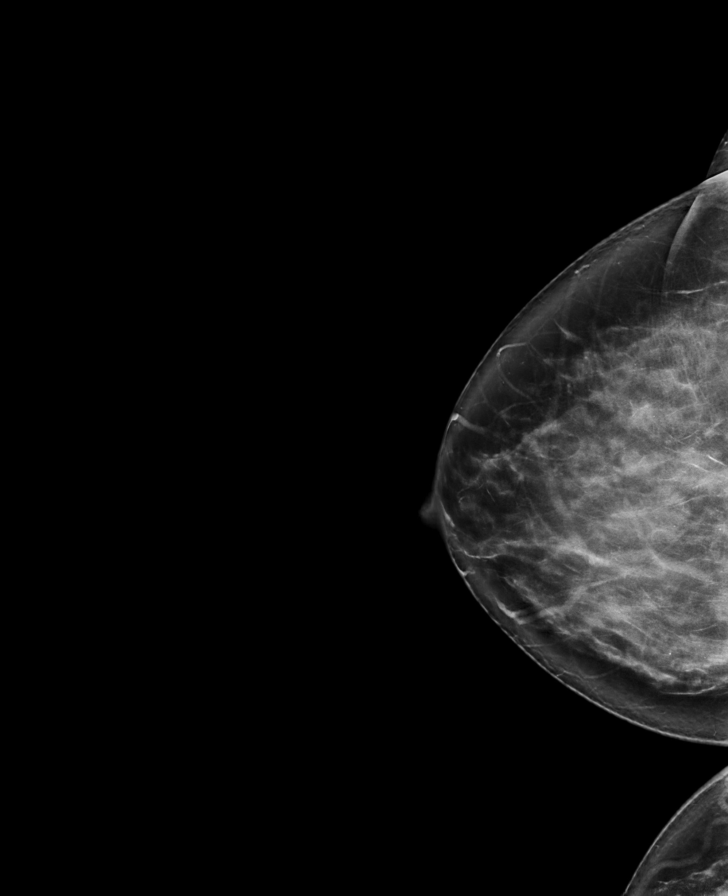

[R MLO synth-2D]
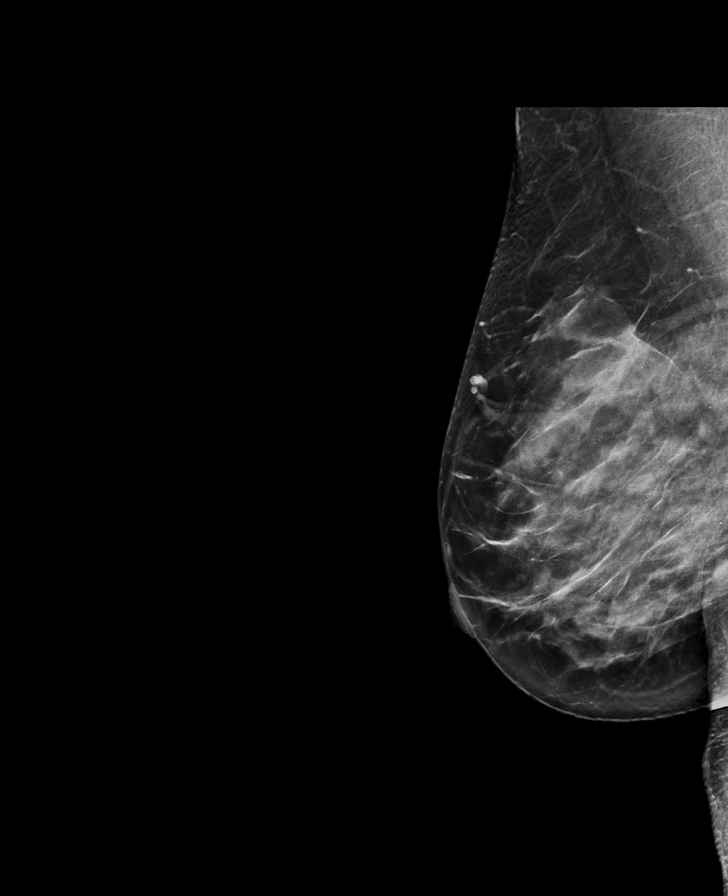

[R CC tomo · tomo slice 43/85.0]
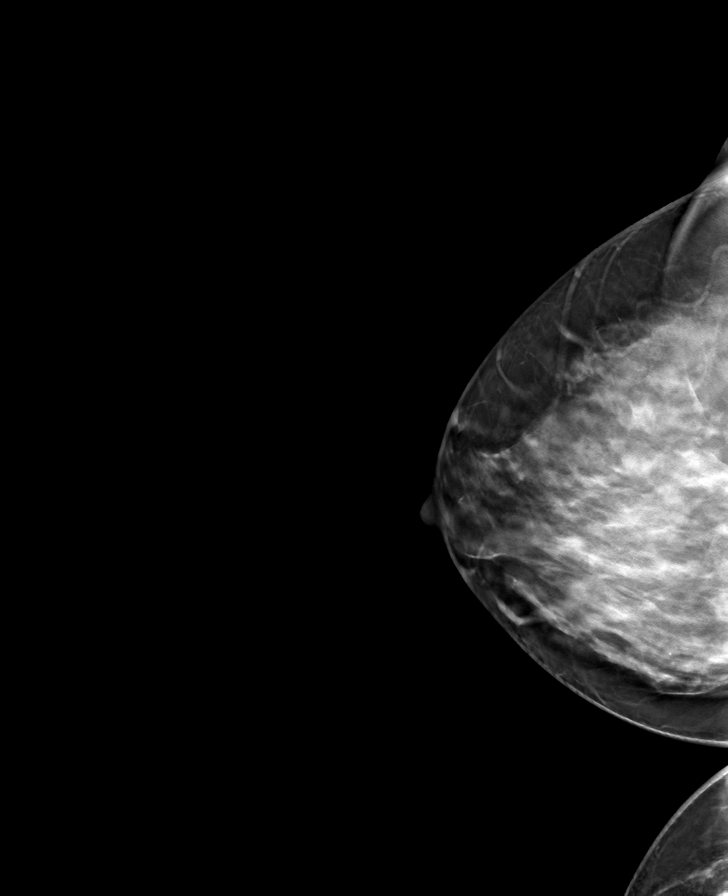

[R MLO tomo · tomo slice 41/80.0]
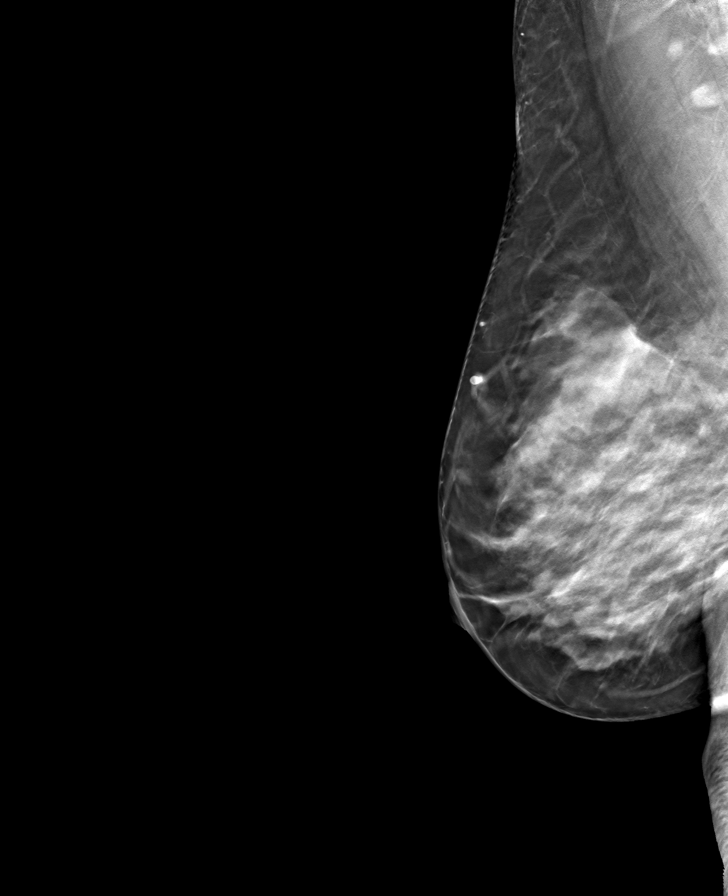

[L CC tomo · tomo slice 39/78.0]
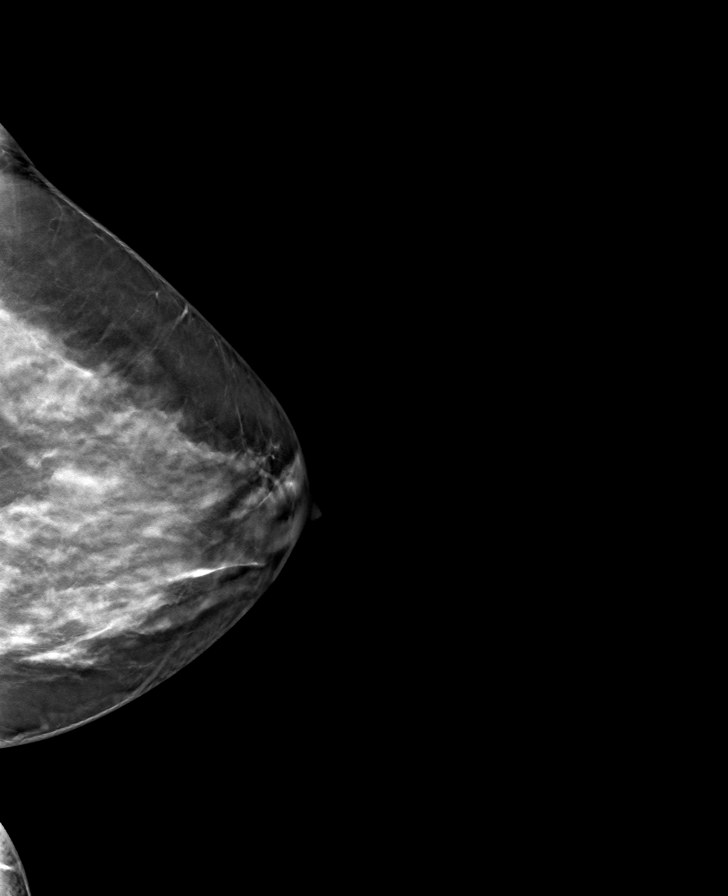

[L MLO tomo · tomo slice 41/81.0]
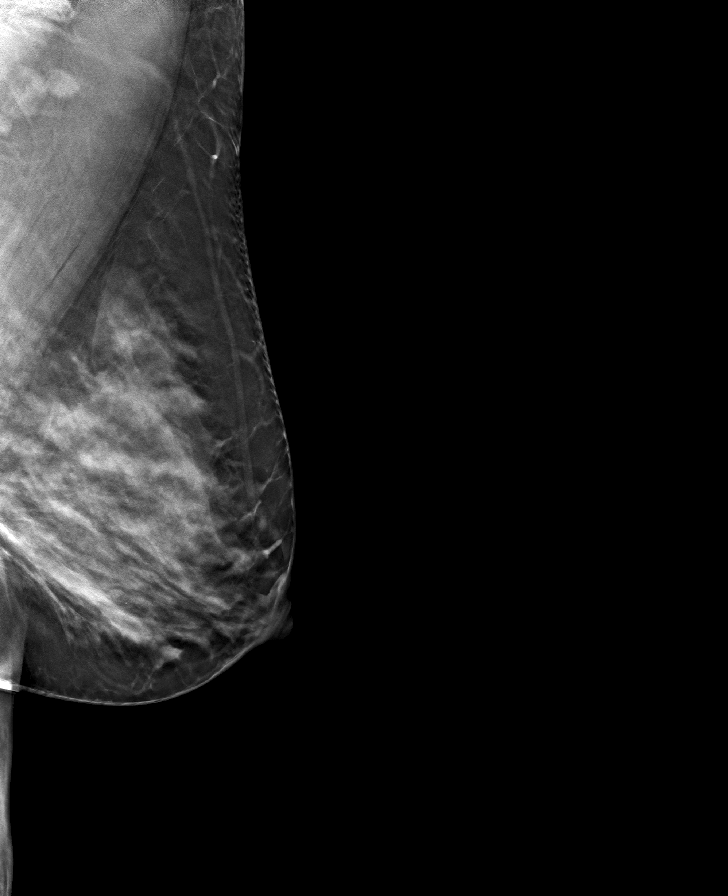

[8 of 24 positions shown; findings below may reference images not displayed]

ACR Breast Density Category d: The breast tissue is extremely dense,
which lowers the sensitivity of mammography.
FINDINGS: There are no findings suspicious for malignancy. Images were
processed with CAD.
IMPRESSION: No mammographic evidence of malignancy. A result letter of this
screening mammogram will be mailed directly to the patient.

RECOMMENDATION:
Screening mammogram in one year. (Code:RA-I-AVB)

BI-RADS CATEGORY  1: Negative.

## 2019-03-12 DIAGNOSIS — Z79899 Other long term (current) drug therapy: Secondary | ICD-10-CM | POA: Diagnosis not present

## 2019-03-12 DIAGNOSIS — L700 Acne vulgaris: Secondary | ICD-10-CM | POA: Diagnosis not present

## 2019-04-11 ENCOUNTER — Ambulatory Visit: Payer: BC Managed Care – PPO

## 2019-04-12 ENCOUNTER — Ambulatory Visit: Payer: BC Managed Care – PPO | Attending: Internal Medicine

## 2019-04-12 DIAGNOSIS — Z23 Encounter for immunization: Secondary | ICD-10-CM

## 2019-04-12 NOTE — Progress Notes (Signed)
   Covid-19 Vaccination Clinic  Name:  Janet Porter    MRN: [id number] DOB: [birth year]  04/12/2019  Ms. Klemmer was observed post Covid-19 immunization for 15 minutes without incident. She was provided with Vaccine Information Sheet and instruction to access the V-Safe system.   Ms. Stayner was instructed to call 911 with any severe reactions post vaccine: Marland Kitchen Difficulty breathing  . Swelling of face and throat  . A fast heartbeat  . A bad rash all over body  . Dizziness and weakness   Immunizations Administered    Name Date Dose VIS Date Route   Pfizer COVID-19 Vaccine 04/12/2019  8:26 AM 0.3 mL 12/28/2018 Intramuscular   Manufacturer: [place]   Lot: [id number]   [place]: [id number]

## 2019-05-06 ENCOUNTER — Ambulatory Visit: Payer: BC Managed Care – PPO | Attending: Internal Medicine

## 2019-05-06 DIAGNOSIS — Z23 Encounter for immunization: Secondary | ICD-10-CM

## 2019-05-06 NOTE — Progress Notes (Signed)
   Covid-19 Vaccination Clinic  Name:  Janet Porter    MRN: [id number] DOB: 1965/12/17  05/06/2019  [name] was observed post Covid-19 immunization for 15 minutes without incident. She was provided with Vaccine Information Sheet and instruction to access the V-Safe system.   [name] was instructed to call 911 with any severe reactions post vaccine: Marland Kitchen Difficulty breathing  . Swelling of face and throat  . A fast heartbeat  . A bad rash all over body  . Dizziness and weakness   Immunizations Administered    Name Date Dose VIS Date Route   Pfizer COVID-19 Vaccine 05/06/2019  2:11 PM 0.3 mL 03/13/2018 Intramuscular   Manufacturer: [place]   Lot: [id number]   [place]: [id number]

## 2019-06-25 ENCOUNTER — Other Ambulatory Visit: Payer: Self-pay | Admitting: Family Medicine

## 2019-07-26 ENCOUNTER — Other Ambulatory Visit: Payer: Self-pay | Admitting: Advanced Practice Midwife

## 2019-07-26 DIAGNOSIS — Z1231 Encounter for screening mammogram for malignant neoplasm of breast: Secondary | ICD-10-CM

## 2019-08-26 ENCOUNTER — Ambulatory Visit
Admission: RE | Admit: 2019-08-26 | Discharge: 2019-08-26 | Disposition: A | Discharge status: Home / Self Care | Payer: BC Managed Care – PPO | Source: Ambulatory Visit | Attending: Advanced Practice Midwife | Admitting: Advanced Practice Midwife

## 2019-08-26 ENCOUNTER — Other Ambulatory Visit: Payer: Self-pay | Admitting: Advanced Practice Midwife

## 2019-08-26 ENCOUNTER — Other Ambulatory Visit: Payer: Self-pay

## 2019-08-26 DIAGNOSIS — Z1231 Encounter for screening mammogram for malignant neoplasm of breast: Secondary | ICD-10-CM

## 2019-08-26 DIAGNOSIS — N644 Mastodynia: Secondary | ICD-10-CM

## 2019-09-17 ENCOUNTER — Ambulatory Visit: Admission: RE | Admit: 2019-09-17 | Payer: BC Managed Care – PPO | Source: Ambulatory Visit

## 2019-09-17 ENCOUNTER — Ambulatory Visit
Admission: RE | Admit: 2019-09-17 | Discharge: 2019-09-17 | Disposition: A | Discharge status: Home / Self Care | Payer: BC Managed Care – PPO | Source: Ambulatory Visit | Attending: Advanced Practice Midwife | Admitting: Advanced Practice Midwife

## 2019-09-17 ENCOUNTER — Ambulatory Visit: Payer: BC Managed Care – PPO

## 2019-09-17 ENCOUNTER — Other Ambulatory Visit: Payer: Self-pay

## 2019-09-17 DIAGNOSIS — N644 Mastodynia: Secondary | ICD-10-CM

## 2019-09-17 DIAGNOSIS — R922 Inconclusive mammogram: Secondary | ICD-10-CM | POA: Diagnosis not present

## 2019-09-29 DIAGNOSIS — Z20822 Contact with and (suspected) exposure to covid-19: Secondary | ICD-10-CM | POA: Diagnosis not present

## 2019-12-14 DIAGNOSIS — Z20822 Contact with and (suspected) exposure to covid-19: Secondary | ICD-10-CM | POA: Diagnosis not present

## 2020-01-01 ENCOUNTER — Other Ambulatory Visit: Payer: Self-pay | Admitting: Family Medicine

## 2020-01-06 ENCOUNTER — Encounter: Payer: Self-pay | Admitting: Radiology

## 2020-01-23 DIAGNOSIS — Z1159 Encounter for screening for other viral diseases: Secondary | ICD-10-CM | POA: Diagnosis not present

## 2020-03-04 ENCOUNTER — Other Ambulatory Visit (HOSPITAL_COMMUNITY)
Admission: RE | Admit: 2020-03-04 | Discharge: 2020-03-04 | Disposition: A | Discharge status: Home / Self Care | Payer: BLUE CROSS/BLUE SHIELD | Source: Ambulatory Visit | Attending: Advanced Practice Midwife | Admitting: Advanced Practice Midwife

## 2020-03-04 ENCOUNTER — Encounter: Payer: Self-pay | Admitting: Family Medicine

## 2020-03-04 ENCOUNTER — Other Ambulatory Visit: Payer: Self-pay

## 2020-03-04 ENCOUNTER — Ambulatory Visit (INDEPENDENT_AMBULATORY_CARE_PROVIDER_SITE_OTHER): Payer: 59 | Admitting: Family Medicine

## 2020-03-04 VITALS — BP 124/78 | HR 84 | Ht 63.0 in | Wt 171.0 lb

## 2020-03-04 DIAGNOSIS — Z01419 Encounter for gynecological examination (general) (routine) without abnormal findings: Secondary | ICD-10-CM

## 2020-03-04 NOTE — Progress Notes (Signed)
  Subjective:     Janet Porter is a 55 y.o. female and is here for a comprehensive physical exam. The patient reports no problems. Reports constipation. Also, with burning under her breasts for years. No rash, had diagnostic mammogram for the same last year and nothing was found.   The following portions of the patient's history were reviewed and updated as appropriate: allergies, current medications, past family history, past medical history, past social history, past surgical history and problem list.  Review of Systems Pertinent items noted in HPI and remainder of comprehensive ROS otherwise negative.   Objective:    BP 124/78   Pulse 84   Ht 5\' 3"  (1.6 m)   Wt 171 lb (77.6 kg)   BMI 30.29 kg/m  General appearance: alert, cooperative and appears stated age Head: Normocephalic, without obvious abnormality, atraumatic Neck: no adenopathy, supple, symmetrical, trachea midline and thyroid not enlarged, symmetric, no tenderness/mass/nodules Lungs: clear to auscultation bilaterally Breasts: normal appearance, no masses or tenderness Heart: regular rate and rhythm, S1, S2 normal, no murmur, click, rub or gallop Abdomen: soft, non-tender; bowel sounds normal; no masses,  no organomegaly Pelvic: cervix normal in appearance, external genitalia normal, no adnexal masses or tenderness, no cervical motion tenderness, rectovaginal septum normal, vagina normal without discharge and uterus is firm,  mobile, palpable fibroid noted Extremities: extremities normal, atraumatic, no cyanosis or edema Pulses: 2+ and symmetric Skin: Skin color, texture, turgor normal. No rashes or lesions Lymph nodes: Cervical, supraclavicular, and axillary nodes normal. Neurologic: Grossly normal    Assessment:    Healthy female exam.      Plan:   Problem List Items Addressed This Visit   None   Visit Diagnoses    Encounter for gynecological examination without abnormal finding    -  Primary   Relevant  Orders   Cytology - PAP   CBC   TSH   Hemoglobin A1c   Comprehensive metabolic panel   Lipid panel   MM 3D SCREEN BREAST BILATERAL     Return in 1 year (on 03/04/2021).    See After Visit Summary for Counseling Recommendations

## 2020-03-04 NOTE — Patient Instructions (Signed)
Preventive Care 84-53 Years Old, Female Preventive care refers to lifestyle choices and visits with your health care provider that can promote health and wellness. This includes:  A yearly physical exam. This is also called an annual wellness visit.  Regular dental and eye exams.  Immunizations.  Screening for certain conditions.  Healthy lifestyle choices, such as: ? Eating a healthy diet. ? Getting regular exercise. ? Not using drugs or products that contain nicotine and tobacco. ? Limiting alcohol use. What can I expect for my preventive care visit? Physical exam Your health care provider will check your:  Height and weight. These may be used to calculate your BMI (body mass index). BMI is a measurement that tells if you are at a healthy weight.  Heart rate and blood pressure.  Body temperature.  Skin for abnormal spots. Counseling Your health care provider may ask you questions about your:  Past medical problems.  Family's medical history.  Alcohol, tobacco, and drug use.  Emotional well-being.  Home life and relationship well-being.  Sexual activity.  Diet, exercise, and sleep habits.  Work and work Statistician.  Access to firearms.  Method of birth control.  Menstrual cycle.  Pregnancy history. What immunizations do I need? Vaccines are usually given at various ages, according to a schedule. Your health care provider will recommend vaccines for you based on your age, medical history, and lifestyle or other factors, such as travel or where you work.   What tests do I need? Blood tests  Lipid and cholesterol levels. These may be checked every 5 years, or more often if you are over 3 years old.  Hepatitis C test.  Hepatitis B test. Screening  Lung cancer screening. You may have this screening every year starting at age 73 if you have a 30-pack-year history of smoking and currently smoke or have quit within the past 15 years.  Colorectal cancer  screening. ? All adults should have this screening starting at age 52 and continuing until age 17. ? Your health care provider may recommend screening at age 49 if you are at increased risk. ? You will have tests every 1-10 years, depending on your results and the type of screening test.  Diabetes screening. ? This is done by checking your blood sugar (glucose) after you have not eaten for a while (fasting). ? You may have this done every 1-3 years.  Mammogram. ? This may be done every 1-2 years. ? Talk with your health care provider about when you should start having regular mammograms. This may depend on whether you have a family history of breast cancer.  BRCA-related cancer screening. This may be done if you have a family history of breast, ovarian, tubal, or peritoneal cancers.  Pelvic exam and Pap test. ? This may be done every 3 years starting at age 10. ? Starting at age 11, this may be done every 5 years if you have a Pap test in combination with an HPV test. Other tests  STD (sexually transmitted disease) testing, if you are at risk.  Bone density scan. This is done to screen for osteoporosis. You may have this scan if you are at high risk for osteoporosis. Talk with your health care provider about your test results, treatment options, and if necessary, the need for more tests. Follow these instructions at home: Eating and drinking  Eat a diet that includes fresh fruits and vegetables, whole grains, lean protein, and low-fat dairy products.  Take vitamin and mineral supplements  as recommended by your health care provider.  Do not drink alcohol if: ? Your health care provider tells you not to drink. ? You are pregnant, may be pregnant, or are planning to become pregnant.  If you drink alcohol: ? Limit how much you have to 0-1 drink a day. ? Be aware of how much alcohol is in your drink. In the [place] one drink equals one 12 oz bottle of beer (355 mL), one 5 oz glass of  wine (148 mL), or one 1 oz glass of hard liquor (44 mL).   Lifestyle  Take daily care of your teeth and gums. Brush your teeth every morning and night with fluoride toothpaste. Floss one time each day.  Stay active. Exercise for at least 30 minutes 5 or more days each week.  Do not use any products that contain nicotine or tobacco, such as cigarettes, e-cigarettes, and chewing tobacco. If you need help quitting, ask your health care provider.  Do not use drugs.  If you are sexually active, practice safe sex. Use a condom or other form of protection to prevent STIs (sexually transmitted infections).  If you do not wish to become pregnant, use a form of birth control. If you plan to become pregnant, see your health care provider for a prepregnancy visit.  If told by your health care provider, take low-dose aspirin daily starting at age 50.  Find healthy ways to cope with stress, such as: ? Meditation, yoga, or listening to music. ? Journaling. ? Talking to a trusted person. ? Spending time with friends and family. Safety  Always wear your seat belt while driving or riding in a vehicle.  Do not drive: ? If you have been drinking alcohol. Do not ride with someone who has been drinking. ? When you are tired or distracted. ? While texting.  Wear a helmet and other protective equipment during sports activities.  If you have firearms in your house, make sure you follow all gun safety procedures. What's next?  Visit your health care provider once a year for an annual wellness visit.  Ask your health care provider how often you should have your eyes and teeth checked.  Stay up to date on all vaccines. This information is not intended to replace advice given to you by your health care provider. Make sure you discuss any questions you have with your health care provider. Document Revised: 10/08/2019 Document Reviewed: 09/14/2017 Elsevier Patient Education  2021 Elsevier Inc.  

## 2020-03-05 LAB — CBC
Hematocrit: 43.8 % (ref 34.0–46.6)
Hemoglobin: 14.6 g/dL (ref 11.1–15.9)
MCH: 30.5 pg (ref 26.6–33.0)
MCHC: 33.3 g/dL (ref 31.5–35.7)
MCV: 91 fL (ref 79–97)
Platelets: 223 10*3/uL (ref 150–450)
RBC: 4.79 x10E6/uL (ref 3.77–5.28)
RDW: 12.7 % (ref 11.7–15.4)
WBC: 4.2 10*3/uL (ref 3.4–10.8)

## 2020-03-05 LAB — COMPREHENSIVE METABOLIC PANEL
ALT: 40 IU/L — ABNORMAL HIGH (ref 0–32)
AST: 32 IU/L (ref 0–40)
Albumin/Globulin Ratio: 2.5 — ABNORMAL HIGH (ref 1.2–2.2)
Albumin: 4.7 g/dL (ref 3.8–4.9)
Alkaline Phosphatase: 94 IU/L (ref 44–121)
BUN/Creatinine Ratio: 17 (ref 9–23)
BUN: 14 mg/dL (ref 6–24)
Bilirubin Total: 0.5 mg/dL (ref 0.0–1.2)
CO2: 22 mmol/L (ref 20–29)
Calcium: 9.2 mg/dL (ref 8.7–10.2)
Chloride: 104 mmol/L (ref 96–106)
Creatinine, Ser: 0.84 mg/dL (ref 0.57–1.00)
GFR calc Af Amer: 92 mL/min/{1.73_m2} (ref >59)
GFR calc non Af Amer: 80 mL/min/{1.73_m2} (ref >59)
Globulin, Total: 1.9 g/dL (ref 1.5–4.5)
Glucose: 107 mg/dL — ABNORMAL HIGH (ref 65–99)
Potassium: 4.2 mmol/L (ref 3.5–5.2)
Sodium: 141 mmol/L (ref 134–144)
Total Protein: 6.6 g/dL (ref 6.0–8.5)

## 2020-03-05 LAB — HEMOGLOBIN A1C
Est. average glucose Bld gHb Est-mCnc: 105 mg/dL
Hgb A1c MFr Bld: 5.3 % (ref 4.8–5.6)

## 2020-03-05 LAB — LIPID PANEL
Chol/HDL Ratio: 3.4 ratio (ref 0.0–4.4)
Cholesterol, Total: 168 mg/dL (ref 100–199)
HDL: 49 mg/dL (ref >39)
LDL Chol Calc (NIH): 97 mg/dL (ref 0–99)
Triglycerides: 124 mg/dL (ref 0–149)
VLDL Cholesterol Cal: 22 mg/dL (ref 5–40)

## 2020-03-05 LAB — TSH: TSH: 3.07 u[IU]/mL (ref 0.450–4.500)

## 2020-03-09 LAB — CYTOLOGY - PAP
Comment: NEGATIVE
Diagnosis: NEGATIVE
High risk HPV: NEGATIVE

## 2020-06-03 ENCOUNTER — Encounter: Payer: Self-pay | Admitting: Dermatology

## 2020-06-03 ENCOUNTER — Ambulatory Visit: Payer: 59 | Admitting: Dermatology

## 2020-06-03 ENCOUNTER — Other Ambulatory Visit: Payer: Self-pay

## 2020-06-03 DIAGNOSIS — L719 Rosacea, unspecified: Secondary | ICD-10-CM

## 2020-06-03 NOTE — Patient Instructions (Addendum)
Amzeeq sample given today to be applied to face at bedtime, thin film.   If you have any questions or concerns for your doctor, please call our main line at [phone] and press option 4 to reach your doctor's medical assistant. If no one answers, please leave a voicemail as directed and we will return your call as soon as possible. Messages left after 4 pm will be answered the following business day.   You may also send [place] a message via [place]. We typically respond to MyChart messages within 1-2 business days.  For prescription refills, please ask your pharmacy to contact our office. Our fax number is [phone].  If you have an urgent issue when the clinic is closed that cannot wait until the next business day, you can page your doctor at the number below.    Please note that while we do our best to be available for urgent issues outside of office hours, we are not available 24/7.   If you have an urgent issue and are unable to reach [place], you may choose to seek medical care at your doctor's office, retail clinic, urgent care center, or emergency room.  If you have a medical emergency, please immediately call 911 or go to the emergency department.  Pager Numbers  - Dr. Nehemiah Massed: [phone]  - Dr. Laurence Ferrari: [phone]  - Dr. Nicole Kindred: [phone]  In the event of inclement weather, please call our main line at [phone] for an update on the status of any delays or closures.  Dermatology Medication Tips: Please keep the boxes that topical medications come in in order to help keep track of the instructions about where and how to use these. Pharmacies typically print the medication instructions only on the boxes and not directly on the medication tubes.   If your medication is too expensive, please contact our office at [phone] option 4 or send [place] a message through [place].   We are unable to tell what your co-pay for medications will be in advance as this is different  depending on your insurance coverage. However, we may be able to find a substitute medication at lower cost or fill out paperwork to get insurance to cover a needed medication.   If a prior authorization is required to get your medication covered by your insurance company, please allow [place] 1-2 business days to complete this process.  Drug prices often vary depending on where the prescription is filled and some pharmacies may offer cheaper prices.  The website www.goodrx.com contains coupons for medications through different pharmacies. The prices here do not account for what the cost may be with help from insurance (it may be cheaper with your insurance), but the website can give you the price if you did not use any insurance.  - You can print the associated coupon and take it with your prescription to the pharmacy.  - You may also stop by our office during regular business hours and pick up a GoodRx coupon card.  - If you need your prescription sent electronically to a different pharmacy, notify our office through [facility] or by phone at [phone] option 4.

## 2020-06-03 NOTE — Progress Notes (Signed)
   New Patient Visit  Subjective  Janet Porter is a 55 y.o. female who presents for the following: Acne (Chin. Dur: 4 years. Has used Rx Tx years ago. Caused GI pain. Using Adapalene 0.3% gel, has used for years. Hx of uterine ablation years ago. Slight grittiness of eyes. Has eye doctor appt tomorrow. ).  Objective  Well appearing patient in no apparent distress; mood and affect are within normal limits.  Review of Systems: No other skin or systemic complaints except as noted in HPI or Assessment and Plan.  A focused examination was performed including head, including the scalp, face, neck, nose, ears, eyelids, and lips. Relevant physical exam findings are noted in the Assessment and Plan.  Objective  face, chin: Scattered inflammatory papules favoring mid face and chin. Trace open comedones, some mid face erythema  Assessment & Plan  Rosacea face, chin  With possible acne overlap.  Chronic condition with duration over one year. Condition is bothersome to patient. Currently flared.  Discuss ocular rosacea with ophthalmologist at appointment tomorrow.  Will hold on oral medication pending ophthalmologist visit.   Rosacea is a chronic progressive skin condition usually affecting the face of adults, causing redness and/or acne bumps. It is treatable but not curable. It sometimes affects the eyes (ocular rosacea) as well. It may respond to topical and/or systemic medication and can flare with stress, sun exposure, alcohol, exercise and some foods.  Daily application of broad spectrum spf 30+ sunscreen to face is recommended to reduce flares.  Prescription Amzeeq sample given today to be applied QHS. Once she has seen ophthalmology tomorrow will decide whether to start doxycycline or send in prescription for topical metronidazole or other.   Return in about 3 months (around 09/03/2020) for rosacea.   I, Emelia Salisbury, CMA, am acting as scribe for Forest Gleason, MD.  Documentation: I  have reviewed the above documentation for accuracy and completeness, and I agree with the above.  Forest Gleason, MD

## 2020-06-04 ENCOUNTER — Encounter: Payer: Self-pay | Admitting: Dermatology

## 2020-06-05 ENCOUNTER — Telehealth: Payer: Self-pay | Admitting: Dermatology

## 2020-06-05 MED ORDER — METRONIDAZOLE 0.75 % EX CREA: TOPICAL_CREAM | Freq: Two times a day (BID) | CUTANEOUS | 11 refills | Status: AC

## 2020-06-05 NOTE — Telephone Encounter (Signed)
Metronidazole cream sent in.

## 2020-06-08 ENCOUNTER — Encounter: Payer: Self-pay | Admitting: Dermatology

## 2020-07-10 ENCOUNTER — Other Ambulatory Visit: Payer: Self-pay | Admitting: Family Medicine

## 2020-07-10 NOTE — Telephone Encounter (Signed)
Please schedule CPE with fasting labs prior with Dr. Diona Browner.  Once scheduled, please send back to me to refill medication.

## 2020-07-10 NOTE — Telephone Encounter (Signed)
Spoke with patient scheduled CPE with fasting labs prior

## 2020-07-23 ENCOUNTER — Telehealth: Payer: Self-pay | Admitting: Family Medicine

## 2020-07-23 DIAGNOSIS — E7800 Pure hypercholesterolemia, unspecified: Secondary | ICD-10-CM

## 2020-07-23 DIAGNOSIS — R7303 Prediabetes: Secondary | ICD-10-CM

## 2020-07-23 NOTE — Telephone Encounter (Signed)
-----   Message from Cloyd Stagers, RT sent at 07/10/2020 12:59 PM EDT ----- Regarding: Lab Orders for Friday 7.8.2022 Please place lab orders for Friday 7.8.2022, office visit for physical on Friday 7.15.2022 Thank you, Dyke Maes RT(R)

## 2020-07-24 ENCOUNTER — Other Ambulatory Visit (INDEPENDENT_AMBULATORY_CARE_PROVIDER_SITE_OTHER): Payer: 59

## 2020-07-24 ENCOUNTER — Other Ambulatory Visit: Payer: Self-pay

## 2020-07-24 DIAGNOSIS — E7800 Pure hypercholesterolemia, unspecified: Secondary | ICD-10-CM

## 2020-07-24 DIAGNOSIS — R7303 Prediabetes: Secondary | ICD-10-CM

## 2020-07-24 LAB — COMPREHENSIVE METABOLIC PANEL
ALT: 33 U/L (ref 0–35)
AST: 25 U/L (ref 0–37)
Albumin: 4.3 g/dL (ref 3.5–5.2)
Alkaline Phosphatase: 81 U/L (ref 39–117)
BUN: 11 mg/dL (ref 6–23)
CO2: 27 mEq/L (ref 19–32)
Calcium: 9.1 mg/dL (ref 8.4–10.5)
Chloride: 105 mEq/L (ref 96–112)
Creatinine, Ser: 0.8 mg/dL (ref 0.40–1.20)
GFR: 84.5 mL/min (ref >60.00)
Glucose, Bld: 105 mg/dL — ABNORMAL HIGH (ref 70–99)
Potassium: 4.2 mEq/L (ref 3.5–5.1)
Sodium: 139 mEq/L (ref 135–145)
Total Bilirubin: 0.8 mg/dL (ref 0.2–1.2)
Total Protein: 6.9 g/dL (ref 6.0–8.3)

## 2020-07-24 LAB — LIPID PANEL
Cholesterol: 172 mg/dL (ref 0–200)
HDL: 38.9 mg/dL — ABNORMAL LOW (ref >39.00)
NonHDL: 132.7
Total CHOL/HDL Ratio: 4
Triglycerides: 244 mg/dL — ABNORMAL HIGH (ref 0.0–149.0)
VLDL: 48.8 mg/dL — ABNORMAL HIGH (ref 0.0–40.0)

## 2020-07-24 LAB — LDL CHOLESTEROL, DIRECT: Direct LDL: 91 mg/dL

## 2020-07-24 LAB — HEMOGLOBIN A1C: Hgb A1c MFr Bld: 5.4 % (ref 4.6–6.5)

## 2020-07-24 NOTE — Progress Notes (Signed)
No critical labs need to be addressed urgently. We will discuss labs in detail at upcoming office visit.   

## 2020-07-31 ENCOUNTER — Encounter: Payer: Self-pay | Admitting: Family Medicine

## 2020-07-31 ENCOUNTER — Ambulatory Visit (INDEPENDENT_AMBULATORY_CARE_PROVIDER_SITE_OTHER): Payer: 59 | Admitting: Family Medicine

## 2020-07-31 ENCOUNTER — Other Ambulatory Visit: Payer: Self-pay

## 2020-07-31 VITALS — BP 126/88 | HR 74 | Temp 97.7°F | Ht 63.0 in | Wt 172.0 lb

## 2020-07-31 DIAGNOSIS — Z0000 Encounter for general adult medical examination without abnormal findings: Secondary | ICD-10-CM | POA: Diagnosis not present

## 2020-07-31 DIAGNOSIS — F411 Generalized anxiety disorder: Secondary | ICD-10-CM | POA: Diagnosis not present

## 2020-07-31 DIAGNOSIS — R7303 Prediabetes: Secondary | ICD-10-CM

## 2020-07-31 DIAGNOSIS — E7800 Pure hypercholesterolemia, unspecified: Secondary | ICD-10-CM | POA: Diagnosis not present

## 2020-07-31 DIAGNOSIS — K760 Fatty (change of) liver, not elsewhere classified: Secondary | ICD-10-CM

## 2020-07-31 NOTE — Assessment & Plan Note (Signed)
Stable, chronic.  Continue current medication.  Simvastatin 20 mg daily 

## 2020-07-31 NOTE — Assessment & Plan Note (Signed)
AST and ALT now in nml range.

## 2020-07-31 NOTE — Patient Instructions (Signed)
Return for Nurse visit in 2-6 months for second Shingrix vaccine.

## 2020-07-31 NOTE — Progress Notes (Signed)
Patient ID: Janet Porter, female    DOB: Mar 16, 1965, 55 y.o.   MRN: [id number]  This visit was conducted in person.  BP 126/88   Pulse 74   Temp 97.7 F (36.5 C) (Temporal)   Ht 5\' 3"  (1.6 m)   Wt 172 lb (78 kg)   SpO2 95%   BMI 30.47 kg/m    CC: Chief Complaint  Patient presents with   Annual Exam    Subjective:   HPI: Janet Porter is a 55 y.o. female presenting on 07/31/2020 for Annual Exam  GAD, stable control on cymbalta  20 mg daily. [place] Visit from 07/31/2020 in [place] at [facility] Total Score 0       BP Readings from Last 3 Encounters:  07/31/20 126/88  03/04/20 124/78  02/20/19 (!) 145/84    Elevated Cholesterol:  LDL at goal on simvastatin 20 mg daily Lab Results  Component Value Date   CHOL 172 07/24/2020   HDL 38.90 (L) 07/24/2020   LDLCALC 97 03/04/2020   LDLDIRECT 91.0 07/24/2020   TRIG 244.0 (H) 07/24/2020   CHOLHDL 4 07/24/2020  The 10-year ASCVD risk score Mikey Bussing DC Jr., et al., 2013) is: 1.8%   Values used to calculate the score:     Age: 47 years     Sex: Female     Is Non-Hispanic African American: No     Diabetic: No     Tobacco smoker: No     Systolic Blood Pressure: 737 mmHg     Is BP treated: No     HDL Cholesterol: 38.9 mg/dL     Total Cholesterol: 172 mg/dL Using medications without problems: none Muscle aches:  none Diet compliance: good.. some sweets Exercise: walking daily Other complaints:    Fatty liver: AST and ALT now in nml range. Wt Readings from Last 3 Encounters:  07/31/20 172 lb (78 kg)  03/04/20 171 lb (77.6 kg)  02/20/19 171 lb (77.6 kg)  Body mass index is 30.47 kg/m.    Prediabetes  Lab Results  Component Value Date   HGBA1C 5.4 07/24/2020         Relevant past medical, surgical, family and social history reviewed and updated as indicated. Interim medical history since our last visit reviewed. Allergies and medications reviewed and  updated. Outpatient Medications Prior to Visit  Medication Sig Dispense Refill   citalopram (CELEXA) 20 MG tablet TAKE 1 TABLET BY MOUTH EVERY DAY 30 tablet 0   metroNIDAZOLE (METROCREAM) 0.75 % cream Apply topically 2 (two) times daily. Affected areas of face for rosacea. 45 g 11   simvastatin (ZOCOR) 20 MG tablet TAKE 1 TABLET BY MOUTH EVERYDAY AT BEDTIME 90 tablet 3   No facility-administered medications prior to visit.     Per HPI unless specifically indicated in ROS section below Review of Systems  Constitutional:  Negative for fatigue and fever.  HENT:  Negative for congestion.   Eyes:  Negative for pain.  Respiratory:  Negative for cough and shortness of breath.   Cardiovascular:  Negative for chest pain, palpitations and leg swelling.  Gastrointestinal:  Negative for abdominal pain.  Genitourinary:  Negative for dysuria and vaginal bleeding.  Musculoskeletal:  Negative for back pain.  Neurological:  Negative for syncope, light-headedness and headaches.  Psychiatric/Behavioral:  Negative for dysphoric mood.   Objective:  BP 126/88   Pulse 74   Temp 97.7 F (36.5 C) (Temporal)   Ht 5\' 3"  (  1.6 m)   Wt 172 lb (78 kg)   SpO2 95%   BMI 30.47 kg/m   Wt Readings from Last 3 Encounters:  07/31/20 172 lb (78 kg)  03/04/20 171 lb (77.6 kg)  02/20/19 171 lb (77.6 kg)      Physical Exam Constitutional:      General: She is not in acute distress.    Appearance: Normal appearance. She is well-developed. She is not ill-appearing or toxic-appearing.  HENT:     Head: Normocephalic.     Right Ear: Hearing, tympanic membrane, ear canal and external ear normal.     Left Ear: Hearing, tympanic membrane, ear canal and external ear normal.     Nose: Nose normal.  Eyes:     General: Lids are normal. Lids are everted, no foreign bodies appreciated.     Conjunctiva/sclera: Conjunctivae normal.     Pupils: Pupils are equal, round, and reactive to light.  Neck:     Thyroid: No thyroid  mass or thyromegaly.     Vascular: No carotid bruit.     Trachea: Trachea normal.  Cardiovascular:     Rate and Rhythm: Normal rate and regular rhythm.     Heart sounds: Normal heart sounds, S1 normal and S2 normal. No murmur heard.   No gallop.  Pulmonary:     Effort: Pulmonary effort is normal. No respiratory distress.     Breath sounds: Normal breath sounds. No wheezing, rhonchi or rales.  Abdominal:     General: Bowel sounds are normal. There is no distension or abdominal bruit.     Palpations: Abdomen is soft. There is no fluid wave or mass.     Tenderness: There is no abdominal tenderness. There is no guarding or rebound.     Hernia: No hernia is present.  Musculoskeletal:     Cervical back: Normal range of motion and neck supple.  Lymphadenopathy:     Cervical: No cervical adenopathy.  Skin:    General: Skin is warm and dry.     Findings: No rash.  Neurological:     Mental Status: She is alert.     Cranial Nerves: No cranial nerve deficit.     Sensory: No sensory deficit.  Psychiatric:        Mood and Affect: Mood is not anxious or depressed.        Speech: Speech normal.        Behavior: Behavior normal. Behavior is cooperative.        Judgment: Judgment normal.      Results for orders placed or performed in visit on 07/24/20  Comprehensive metabolic panel  Result Value Ref Range   Sodium 139 135 - 145 mEq/L   Potassium 4.2 3.5 - 5.1 mEq/L   Chloride 105 96 - 112 mEq/L   CO2 27 19 - 32 mEq/L   Glucose, Bld 105 (H) 70 - 99 mg/dL   BUN 11 6 - 23 mg/dL   Creatinine, Ser 0.80 0.40 - 1.20 mg/dL   Total Bilirubin 0.8 0.2 - 1.2 mg/dL   Alkaline Phosphatase 81 39 - 117 U/L   AST 25 0 - 37 U/L   ALT 33 0 - 35 U/L   Total Protein 6.9 6.0 - 8.3 g/dL   Albumin 4.3 3.5 - 5.2 g/dL   GFR 84.50 >60.00 mL/min   Calcium 9.1 8.4 - 10.5 mg/dL  Lipid panel  Result Value Ref Range   Cholesterol 172 0 - 200 mg/dL   Triglycerides  244.0 (H) 0.0 - 149.0 mg/dL   HDL 38.90 (L)  >39.00 mg/dL   VLDL 48.8 (H) 0.0 - 40.0 mg/dL   Total CHOL/HDL Ratio 4    NonHDL 132.70   Hemoglobin A1c  Result Value Ref Range   Hgb A1c MFr Bld 5.4 4.6 - 6.5 %  LDL cholesterol, direct  Result Value Ref Range   Direct LDL 91.0 mg/dL    This visit occurred during the SARS-CoV-2 public health emergency.  Safety protocols were in place, including screening questions prior to the visit, additional usage of staff PPE, and extensive cleaning of exam room while observing appropriate contact time as indicated for disinfecting solutions.   COVID 19 screen:  No recent travel or known exposure to COVID19 The patient denies respiratory symptoms of COVID 19 at this time. The importance of social distancing was discussed today.   Assessment and Plan The patient's preventative maintenance and recommended screening tests for an annual wellness exam were reviewed in full today. Brought up to date unless services declined.  Counselled on the importance of diet, exercise, and its role in overall health and mortality. The patient's FH and SH was reviewed, including their home life, tobacco status, and drug and alcohol status.     Vaccines:  Uptodate with flu and td. Consider Shingrix  COVID x 3 Pap/DVE: 02/2020 with GYN, Dr. Kennon Rounds Mammo:  08/2019.. scheduled for 09/2020 Colon:  02/16/2018 colonoscopy sessile polyp, family history, repeat 5 years per Dr. Tarri Glenn Smoking Status: none ETOH/ drug use: none/none HIV screen:   refused  Problem List Items Addressed This Visit     Fatty liver    AST and ALT now in nml range.       Generalized anxiety disorder    Stable, chronic.  Continue current medication.   Cymbalta 20 mg daily       HYPERCHOLESTEROLEMIA    Stable, chronic.  Continue current medication.   Simvastatin 20 mg daily.       Prediabetes    Resolved per A1C with diet and lifestyle changes.       Other Visit Diagnoses     Routine general medical examination at a  health care facility    -  Primary        Eliezer Lofts, MD

## 2020-07-31 NOTE — Assessment & Plan Note (Signed)
Resolved per A1C with diet and lifestyle changes.

## 2020-07-31 NOTE — Assessment & Plan Note (Signed)
Stable, chronic.  Continue current medication.   Cymbalta 20 mg daily

## 2020-08-10 ENCOUNTER — Other Ambulatory Visit: Payer: Self-pay | Admitting: Family Medicine

## 2020-09-16 ENCOUNTER — Ambulatory Visit: Payer: 59 | Admitting: Dermatology

## 2020-09-18 ENCOUNTER — Other Ambulatory Visit: Payer: Self-pay

## 2020-09-18 ENCOUNTER — Ambulatory Visit
Admission: RE | Admit: 2020-09-18 | Discharge: 2020-09-18 | Disposition: A | Discharge status: Home / Self Care | Payer: 59 | Source: Ambulatory Visit | Attending: Family Medicine | Admitting: Family Medicine

## 2020-09-18 DIAGNOSIS — Z01419 Encounter for gynecological examination (general) (routine) without abnormal findings: Secondary | ICD-10-CM

## 2021-01-07 ENCOUNTER — Telehealth (INDEPENDENT_AMBULATORY_CARE_PROVIDER_SITE_OTHER): Payer: 59 | Admitting: Family Medicine

## 2021-01-07 ENCOUNTER — Other Ambulatory Visit: Payer: Self-pay

## 2021-01-07 ENCOUNTER — Encounter: Payer: Self-pay | Admitting: Family Medicine

## 2021-01-07 VITALS — Temp 98.0°F | Ht 63.0 in | Wt 180.0 lb

## 2021-01-07 DIAGNOSIS — U071 COVID-19: Secondary | ICD-10-CM | POA: Insufficient documentation

## 2021-01-07 NOTE — Assessment & Plan Note (Signed)
COVID19  Infection >5 days from onset of symptoms in triple vaccinated overweight individual.  No clear sign of bacterial infection at this time.   No SOB.  No red flags/need for ER visit or in-person exam at respiratory clinic at this time..    Not a candidate for antivirals given > 5 days from onset of illness, on day 7. She is agreeable as she has already started to improve..   Symptomatic care with mucinex and cough suppressant at night. If SOB begins symptoms worsening.. have low threshold for in-person exam, if severe shortness of breath ER visit recommended.  Can monitor Oxygen saturation at home with home monitor if able to obtain.  Go to ER if O2 sat < 90% on room air.   Reviewed home care and provided information through [place].  Recommended quarantine 5 days isolation recommended. Return to work day 6 and wear mask for 4 more days to complete 10 days. Provided info about prevention of spread of COVID 19.

## 2021-01-07 NOTE — Progress Notes (Signed)
VIRTUAL VISIT Due to national recommendations of social distancing due to [place] 19, a virtual visit is felt to be most appropriate for this patient at this time.   I connected with the patient on 01/07/21 at 10:20 AM EST by virtual telehealth platform and verified that I am speaking with the correct person using two identifiers.   I discussed the limitations, risks, security and privacy concerns of performing an evaluation and management service by  virtual telehealth platform and the availability of in person appointments. I also discussed with the patient that there may be a patient responsible charge related to this service. The patient expressed understanding and agreed to proceed.  Patient location: Home Provider Location: [facility] [place] Participants: Eliezer Lofts and Bennie Pierini   Chief Complaint  Patient presents with   Covid Positive    Positive home test yesterday. Symptoms started last Friday-Negative test on Saturday   Nasal Congestion   Fatigue   Brain Fog   Elevated Heart Rate    History of Present Illness:  55 year old female patient with history of obesity presents with new COVID infeciton   Date of onset: 12/16  Started with fatigue, body aches, chills, congestion, productive cough.  Subjective fever.  NO SOB, no wheeze  Has noted some increase of palpitations.... off and on. Asymptomatic at the time.  She is using  medication off and on.  Sleeping well at night.  Not taking any medication to treat.    COVID 19 screen COVID testing: negative test  12/18, positive test 12/20 COVID vaccine:   12/01/2019 , 05/06/2019 , 04/12/2019  Pfizer Covid-19 Vaccine Bivalent Booster 11/18/2020   COVID exposure:HAS known exposure to [place].. husband  The importance of social distancing was discussed today.    Review of Systems  Constitutional:  Positive for chills, fever and malaise/fatigue.  HENT:  Positive for congestion. Negative for ear pain.   Eyes:   Negative for pain and redness.  Respiratory:  Positive for cough. Negative for shortness of breath.   Cardiovascular:  Negative for chest pain, palpitations and leg swelling.  Gastrointestinal:  Negative for abdominal pain, blood in stool, constipation, diarrhea, nausea and vomiting.  Genitourinary:  Negative for dysuria.  Musculoskeletal:  Negative for falls and myalgias.  Skin:  Negative for rash.  Neurological:  Negative for dizziness.  Psychiatric/Behavioral:  Negative for depression. The patient is not nervous/anxious.      Past Medical History:  Diagnosis Date   Abnormal Pap smear 2005   Anxiety state, unspecified    Atypical nevus 11/02/2007   R post upper arm - excision 12/05/2007   Benign neoplasm of skin, site unspecified    Cancer ([place])    skin cancer    Depressive disorder, not elsewhere classified    Dysplastic nevus 10/27/2006   L med knee - mild   Dysplastic nevus 11/25/2008   back - mild   Esophageal reflux    pt does not take meds   Headache(784.0)    Other specified respiratory tuberculosis, tubercle bacilli not found by bacteriological examination, but tuberculosis confirmed histologically    Pure hypercholesterolemia    Unspecified sinusitis (chronic)     reports that she has never smoked. She has never used smokeless tobacco. She reports that she does not drink alcohol and does not use drugs.   Current Outpatient Medications:    citalopram (CELEXA) 20 MG tablet, TAKE 1 TABLET BY MOUTH EVERY DAY, Disp: 90 tablet, Rfl: 1   metroNIDAZOLE (METROCREAM)  0.75 % cream, Apply topically 2 (two) times daily. Affected areas of face for rosacea., Disp: 45 g, Rfl: 11   simvastatin (ZOCOR) 20 MG tablet, TAKE 1 TABLET BY MOUTH EVERYDAY AT BEDTIME, Disp: 90 tablet, Rfl: 3   Observations/Objective: Temperature 98 F (36.7 C), temperature source Temporal, height 5\' 3"  (1.6 m), weight 180 lb (81.6 kg).  Physical Exam  Physical Exam Constitutional:      General: The  patient is not in acute distress. Pulmonary:     Effort: Pulmonary effort is normal. No respiratory distress.  Neurological:     Mental Status: The patient is alert and oriented to person, place, and time.  Psychiatric:        Mood and Affect: Mood normal.        Behavior: Behavior normal.   Assessment and Plan Problem List Items Addressed This Visit     COVID-19 virus infection - Primary    COVID19  Infection >5 days from onset of symptoms in triple vaccinated overweight individual.  No clear sign of bacterial infection at this time.   No SOB.  No red flags/need for ER visit or in-person exam at respiratory clinic at this time..    Not a candidate for antivirals given > 5 days from onset of illness, on day 7. She is agreeable as she has already started to improve..   Symptomatic care with mucinex and cough suppressant at night. If SOB begins symptoms worsening.. have low threshold for in-person exam, if severe shortness of breath ER visit recommended.  Can monitor Oxygen saturation at home with home monitor if able to obtain.  Go to ER if O2 sat < 90% on room air.   Reviewed home care and provided information through [place].  Recommended quarantine 5 days isolation recommended. Return to work day 6 and wear mask for 4 more days to complete 10 days. Provided info about prevention of spread of COVID 19.          I discussed the assessment and treatment plan with the patient. The patient was provided an opportunity to ask questions and all were answered. The patient agreed with the plan and demonstrated an understanding of the instructions.   The patient was advised to call back or seek an in-person evaluation if the symptoms worsen or if the condition fails to improve as anticipated.     Eliezer Lofts, MD

## 2021-01-12 ENCOUNTER — Encounter: Payer: Self-pay | Admitting: Family Medicine

## 2021-01-12 ENCOUNTER — Encounter: Payer: Self-pay | Admitting: Radiology

## 2021-01-15 ENCOUNTER — Other Ambulatory Visit: Payer: Self-pay | Admitting: Family Medicine

## 2021-02-17 ENCOUNTER — Other Ambulatory Visit: Payer: Self-pay | Admitting: Family Medicine

## 2021-08-06 ENCOUNTER — Other Ambulatory Visit: Payer: Self-pay | Admitting: Family Medicine

## 2021-08-08 NOTE — Telephone Encounter (Signed)
Please schedule CPE with fasting labs prior with Dr. [name].  

## 2021-08-09 ENCOUNTER — Other Ambulatory Visit: Payer: Self-pay | Admitting: Family Medicine

## 2021-08-09 DIAGNOSIS — Z1231 Encounter for screening mammogram for malignant neoplasm of breast: Secondary | ICD-10-CM

## 2021-08-09 NOTE — Telephone Encounter (Signed)
Called patient and VM full.  

## 2021-08-27 ENCOUNTER — Other Ambulatory Visit: Payer: 59

## 2021-09-04 ENCOUNTER — Telehealth: Payer: Self-pay | Admitting: Family Medicine

## 2021-09-04 DIAGNOSIS — R7303 Prediabetes: Secondary | ICD-10-CM

## 2021-09-04 DIAGNOSIS — E7800 Pure hypercholesterolemia, unspecified: Secondary | ICD-10-CM

## 2021-09-04 NOTE — Telephone Encounter (Signed)
-----   Message from Janet Porter sent at 08/26/2021  3:33 PM EDT ----- Regarding: Lab orders for Monday, 8.21.23 Patient is scheduled for CPX labs, please order future labs, Thanks , Karna Christmas

## 2021-09-06 ENCOUNTER — Other Ambulatory Visit (INDEPENDENT_AMBULATORY_CARE_PROVIDER_SITE_OTHER): Payer: 59

## 2021-09-06 ENCOUNTER — Other Ambulatory Visit: Payer: 59

## 2021-09-06 DIAGNOSIS — R7303 Prediabetes: Secondary | ICD-10-CM

## 2021-09-06 DIAGNOSIS — E7800 Pure hypercholesterolemia, unspecified: Secondary | ICD-10-CM | POA: Diagnosis not present

## 2021-09-06 LAB — LIPID PANEL
Cholesterol: 182 mg/dL (ref 0–200)
HDL: 41.2 mg/dL (ref >39.00)
NonHDL: 141.11
Total CHOL/HDL Ratio: 4
Triglycerides: 230 mg/dL — ABNORMAL HIGH (ref 0.0–149.0)
VLDL: 46 mg/dL — ABNORMAL HIGH (ref 0.0–40.0)

## 2021-09-06 LAB — COMPREHENSIVE METABOLIC PANEL
ALT: 37 U/L — ABNORMAL HIGH (ref 0–35)
AST: 26 U/L (ref 0–37)
Albumin: 4.5 g/dL (ref 3.5–5.2)
Alkaline Phosphatase: 82 U/L (ref 39–117)
BUN: 12 mg/dL (ref 6–23)
CO2: 28 mEq/L (ref 19–32)
Calcium: 9.2 mg/dL (ref 8.4–10.5)
Chloride: 103 mEq/L (ref 96–112)
Creatinine, Ser: 0.87 mg/dL (ref 0.40–1.20)
GFR: 75.81 mL/min (ref >60.00)
Glucose, Bld: 120 mg/dL — ABNORMAL HIGH (ref 70–99)
Potassium: 4.2 mEq/L (ref 3.5–5.1)
Sodium: 138 mEq/L (ref 135–145)
Total Bilirubin: 1 mg/dL (ref 0.2–1.2)
Total Protein: 7 g/dL (ref 6.0–8.3)

## 2021-09-06 LAB — LDL CHOLESTEROL, DIRECT: Direct LDL: 104 mg/dL

## 2021-09-06 LAB — HEMOGLOBIN A1C: Hgb A1c MFr Bld: 5.5 % (ref 4.6–6.5)

## 2021-09-07 NOTE — Progress Notes (Signed)
No critical labs need to be addressed urgently. We will discuss labs in detail at upcoming office visit.   

## 2021-09-10 ENCOUNTER — Encounter: Payer: Self-pay | Admitting: Family Medicine

## 2021-09-10 ENCOUNTER — Ambulatory Visit (INDEPENDENT_AMBULATORY_CARE_PROVIDER_SITE_OTHER): Payer: 59 | Admitting: Family Medicine

## 2021-09-10 VITALS — BP 120/80 | HR 62 | Temp 98.6°F | Ht 63.0 in | Wt 173.5 lb

## 2021-09-10 DIAGNOSIS — Z0000 Encounter for general adult medical examination without abnormal findings: Secondary | ICD-10-CM

## 2021-09-10 DIAGNOSIS — K760 Fatty (change of) liver, not elsewhere classified: Secondary | ICD-10-CM

## 2021-09-10 DIAGNOSIS — E7800 Pure hypercholesterolemia, unspecified: Secondary | ICD-10-CM

## 2021-09-10 DIAGNOSIS — F411 Generalized anxiety disorder: Secondary | ICD-10-CM

## 2021-09-10 DIAGNOSIS — Z23 Encounter for immunization: Secondary | ICD-10-CM

## 2021-09-10 DIAGNOSIS — K219 Gastro-esophageal reflux disease without esophagitis: Secondary | ICD-10-CM

## 2021-09-10 DIAGNOSIS — R7303 Prediabetes: Secondary | ICD-10-CM

## 2021-09-10 NOTE — Patient Instructions (Addendum)
Start Prilosec 20 mg  daily for 2-4 weeks then wean off.  Avoid acid triggers.   Work on heart healthy diet.  Can use glucosamine for ache in knees bilaterally.

## 2021-09-10 NOTE — Assessment & Plan Note (Signed)
Stable liver function test.

## 2021-09-10 NOTE — Progress Notes (Signed)
Patient ID: Janet Porter, female    DOB: 1965/08/20, 56 y.o.   MRN: [id number]  This visit was conducted in person.  BP 120/80   Pulse 62   Temp 98.6 F (37 C) (Oral)   Ht '5\' 3"'$  (1.6 m)   Wt 173 lb 8 oz (78.7 kg)   SpO2 97%   BMI 30.73 kg/m    CC:  Chief Complaint  Patient presents with   Annual Exam    Subjective:   HPI: Janet Porter is a 56 y.o. female presenting on 09/10/2021 for Annual Exam  Elevated Cholesterol: LDL almost at goal less than 100 on simvastatin 20 mg p.o. daily Lab Results  Component Value Date   CHOL 182 09/06/2021   HDL 41.20 09/06/2021   LDLCALC 97 03/04/2020   LDLDIRECT 104.0 09/06/2021   TRIG 230.0 (H) 09/06/2021   CHOLHDL 4 09/06/2021  Using medications without problems: none Muscle aches:  none Diet compliance: low cholesterol in diet occ Exercise: Other complaints:  Hx of fatty liver disease   GAD: Controlled on Celexa 20 mg p.o. daily Some increase in stress with son moving away.   Prediabetes:  Lab Results  Component Value Date   HGBA1C 5.5 09/06/2021    BP Readings from Last 3 Encounters:  09/10/21 120/80  07/31/20 126/88  03/04/20 124/78     Occ acid feeling/ burning in throat and in chest, sometimes after eating but sometime not.  Occurring 3-4 times week. Wt Readings from Last 3 Encounters:  09/10/21 173 lb 8 oz (78.7 kg)  01/07/21 180 lb (81.6 kg)  07/31/20 172 lb (78 kg)         Relevant past medical, surgical, family and social history reviewed and updated as indicated. Interim medical history since our last visit reviewed. Allergies and medications reviewed and updated. Outpatient Medications Prior to Visit  Medication Sig Dispense Refill   citalopram (CELEXA) 20 MG tablet TAKE 1 TABLET BY MOUTH EVERY DAY 90 tablet 0   simvastatin (ZOCOR) 20 MG tablet TAKE 1 TABLET BY MOUTH EVERYDAY AT BEDTIME 90 tablet 0   No facility-administered medications prior to visit.     Per HPI unless specifically  indicated in ROS section below Review of Systems  Constitutional:  Negative for fatigue and fever.  HENT:  Negative for congestion.   Eyes:  Negative for pain.  Respiratory:  Negative for cough and shortness of breath.   Cardiovascular:  Negative for chest pain, palpitations and leg swelling.  Gastrointestinal:  Negative for abdominal pain.  Genitourinary:  Negative for dysuria and vaginal bleeding.  Musculoskeletal:  Negative for back pain.  Neurological:  Negative for syncope, light-headedness and headaches.  Psychiatric/Behavioral:  Negative for dysphoric mood.    Objective:  BP 120/80   Pulse 62   Temp 98.6 F (37 C) (Oral)   Ht '5\' 3"'$  (1.6 m)   Wt 173 lb 8 oz (78.7 kg)   SpO2 97%   BMI 30.73 kg/m   Wt Readings from Last 3 Encounters:  09/10/21 173 lb 8 oz (78.7 kg)  01/07/21 180 lb (81.6 kg)  07/31/20 172 lb (78 kg)      Physical Exam    Results for orders placed or performed in visit on 09/06/21  Comprehensive metabolic panel  Result Value Ref Range   Sodium 138 135 - 145 mEq/L   Potassium 4.2 3.5 - 5.1 mEq/L   Chloride 103 96 - 112 mEq/L   CO2 28 19 -  32 mEq/L   Glucose, Bld 120 (H) 70 - 99 mg/dL   BUN 12 6 - 23 mg/dL   Creatinine, Ser 0.87 0.40 - 1.20 mg/dL   Total Bilirubin 1.0 0.2 - 1.2 mg/dL   Alkaline Phosphatase 82 39 - 117 U/L   AST 26 0 - 37 U/L   ALT 37 (H) 0 - 35 U/L   Total Protein 7.0 6.0 - 8.3 g/dL   Albumin 4.5 3.5 - 5.2 g/dL   GFR 75.81 >60.00 mL/min   Calcium 9.2 8.4 - 10.5 mg/dL  Lipid panel  Result Value Ref Range   Cholesterol 182 0 - 200 mg/dL   Triglycerides 230.0 (H) 0.0 - 149.0 mg/dL   HDL 41.20 >39.00 mg/dL   VLDL 46.0 (H) 0.0 - 40.0 mg/dL   Total CHOL/HDL Ratio 4    NonHDL 141.11   Hemoglobin A1c  Result Value Ref Range   Hgb A1c MFr Bld 5.5 4.6 - 6.5 %  LDL cholesterol, direct  Result Value Ref Range   Direct LDL 104.0 mg/dL     COVID 19 screen:  No recent travel or known exposure to COVID19 The patient denies  respiratory symptoms of COVID 19 at this time. The importance of social distancing was discussed today.   Assessment and Plan   The patient's preventative maintenance and recommended screening tests for an annual wellness exam were reviewed in full today. Brought up to date unless services declined.  Counselled on the importance of diet, exercise, and its role in overall health and mortality. The patient's FH and SH was reviewed, including their home life, tobacco status, and drug and alcohol status.    Vaccines:  Uptodate  td. Completed Shingrix today and given flu vaccine.  COVID x 3 Pap/DVE: 02/2020 with GYN, Dr. Kennon Rounds Mammo:  09/2020.. scheduled for 09/2021 Colon:  02/16/2018 colonoscopy sessile polyp, family history, repeat 5 years per Dr. Tarri Glenn Smoking Status: none ETOH/ drug use: none/none HIV screen:   refused Hep c: done  Problem List Items Addressed This Visit     Fatty liver    Stable liver function test.      Generalized anxiety disorder    Stable, chronic.  Continue current medication.   Celexa 20 mg p.o. daily      GERD    Acute, avoid acid triggers.  Start Prilosec low-dose 20 mg 2 to 4 weeks, call if not improving as expected.      HYPERCHOLESTEROLEMIA    Chronic, LDL almost at goal less than 100 on rosuvastatin 20 mg daily.  Triglycerides again elevated.  She will work on low-fat low-cholesterol heart healthy diet.  Increase exercise as tolerated.  Simvastatin 20 mg p.o. daily      Prediabetes    A1c in normal range at 5.5 but fasting blood sugar of 128 this year.  Encouraged her to work on [employer], increased exercise and weight loss.      Other Visit Diagnoses     Routine general medical examination at a health care facility    -  Primary   Need for influenza vaccination       Relevant Orders   Flu Vaccine QUAD 6+ mos PF IM (Fluarix Quad PF) (Completed)   Need for shingles vaccine       Relevant Orders   Zoster Recombinant (Shingrix )  (Completed)        Eliezer Lofts, MD

## 2021-09-10 NOTE — Assessment & Plan Note (Signed)
Acute, avoid acid triggers.  Start Prilosec low-dose 20 mg 2 to 4 weeks, call if not improving as expected.

## 2021-09-10 NOTE — Assessment & Plan Note (Signed)
Stable, chronic.  Continue current medication.   Celexa 20 mg p.o. daily

## 2021-09-10 NOTE — Assessment & Plan Note (Signed)
A1c in normal range at 5.5 but fasting blood sugar of 128 this year.  Encouraged her to work on [employer], increased exercise and weight loss.

## 2021-09-10 NOTE — Assessment & Plan Note (Addendum)
Chronic, LDL almost at goal less than 100 on rosuvastatin 20 mg daily.  Triglycerides again elevated.  She will work on low-fat low-cholesterol heart healthy diet.  Increase exercise as tolerated.  Simvastatin 20 mg p.o. daily

## 2021-09-22 ENCOUNTER — Ambulatory Visit
Admission: RE | Admit: 2021-09-22 | Discharge: 2021-09-22 | Disposition: A | Discharge status: Home / Self Care | Payer: 59 | Source: Ambulatory Visit

## 2021-09-22 DIAGNOSIS — Z1231 Encounter for screening mammogram for malignant neoplasm of breast: Secondary | ICD-10-CM

## 2021-11-06 ENCOUNTER — Other Ambulatory Visit: Payer: Self-pay | Admitting: Family Medicine

## 2022-01-22 ENCOUNTER — Other Ambulatory Visit: Payer: Self-pay | Admitting: Family Medicine

## 2022-03-16 ENCOUNTER — Encounter: Payer: Self-pay | Admitting: Family Medicine

## 2022-03-16 ENCOUNTER — Other Ambulatory Visit (HOSPITAL_COMMUNITY)
Admission: RE | Admit: 2022-03-16 | Discharge: 2022-03-16 | Disposition: A | Discharge status: Home / Self Care | Payer: No Typology Code available for payment source | Source: Ambulatory Visit | Attending: Family Medicine | Admitting: Family Medicine

## 2022-03-16 ENCOUNTER — Ambulatory Visit: Payer: No Typology Code available for payment source | Admitting: Family Medicine

## 2022-03-16 VITALS — BP 137/82 | HR 74 | Ht 63.0 in | Wt 177.0 lb

## 2022-03-16 DIAGNOSIS — N841 Polyp of cervix uteri: Secondary | ICD-10-CM | POA: Insufficient documentation

## 2022-03-16 DIAGNOSIS — Z124 Encounter for screening for malignant neoplasm of cervix: Secondary | ICD-10-CM

## 2022-03-16 DIAGNOSIS — R030 Elevated blood-pressure reading, without diagnosis of hypertension: Secondary | ICD-10-CM | POA: Diagnosis not present

## 2022-03-16 DIAGNOSIS — Z01419 Encounter for gynecological examination (general) (routine) without abnormal findings: Secondary | ICD-10-CM

## 2022-03-16 NOTE — Assessment & Plan Note (Signed)
Normal on re-check--DASH diet

## 2022-03-16 NOTE — Progress Notes (Signed)
Patient presents for Annual.  Ablation Last pap:03/04/20 WNL pt wants pap today. Contraception:condoms Mammogram:09/22/2021  Flu Vaccine : Already received  CC: None   Fun Fact: Patient likes to [employer].

## 2022-03-16 NOTE — Progress Notes (Signed)
Subjective:     Janet Porter is a 57 y.o. female and is here for a comprehensive physical exam. The patient reports no problems.   The following portions of the patient's history were reviewed and updated as appropriate: allergies, current medications, past family history, past medical history, past social history, past surgical history, and problem list.  Review of Systems Pertinent items noted in HPI and remainder of comprehensive ROS otherwise negative.   Objective:  Chaperone present for exam   BP 137/82   Pulse 74   Ht '5\' 3"'$  (1.6 m)   Wt 177 lb (80.3 kg)   BMI 31.35 kg/m  General appearance: alert, cooperative, and appears stated age Head: Normocephalic, without obvious abnormality, atraumatic Neck: no adenopathy, supple, symmetrical, trachea midline, and thyroid not enlarged, symmetric, no tenderness/mass/nodules Lungs: clear to auscultation bilaterally Breasts: normal appearance, no masses or tenderness Heart: regular rate and rhythm, S1, S2 normal, no murmur, click, rub or gallop Abdomen: soft, non-tender; bowel sounds normal; no masses,  no organomegaly Pelvic: external genitalia normal, no adnexal masses or tenderness, no cervical motion tenderness, uterus normal size, shape, and consistency, vagina normal without discharge, and cervix with polyp Extremities: extremities normal, atraumatic, no cyanosis or edema Pulses: 2+ and symmetric Skin: Skin color, texture, turgor normal. No rashes or lesions Lymph nodes: Cervical, supraclavicular, and axillary nodes normal. Neurologic: Grossly normal    Procedure: Cervix visualized and polyp noted.  After pap smear obtained.  Ring forcep applied to cervix and twisting motion removed polyp intact.  Hemostasis obtained with Monsel's solution.  Assessment:    Healthy female exam.      Plan:   Problem List Items Addressed This Visit       Unprioritized   Elevated blood pressure reading    Normal on re-check--DASH diet       Other Visit Diagnoses     Encounter for gynecological examination without abnormal finding    -  Primary   Screening for cervical cancer       Relevant Orders   Cytology - PAP( [facility])   Cervical polyp       Likely begnign--sent to pathology   Relevant Orders   Surgical pathology( [place])      Return in 1 year (on 03/17/2023).    See After Visit Summary for Counseling Recommendations

## 2022-03-17 LAB — SURGICAL PATHOLOGY

## 2022-03-21 LAB — CYTOLOGY - PAP
Comment: NEGATIVE
Diagnosis: UNDETERMINED — AB
High risk HPV: NEGATIVE

## 2022-04-29 ENCOUNTER — Other Ambulatory Visit: Payer: Self-pay | Admitting: Family Medicine

## 2022-05-25 ENCOUNTER — Encounter: Payer: Self-pay | Admitting: Family Medicine

## 2022-07-26 ENCOUNTER — Other Ambulatory Visit: Payer: Self-pay | Admitting: Family Medicine

## 2022-09-01 ENCOUNTER — Encounter (INDEPENDENT_AMBULATORY_CARE_PROVIDER_SITE_OTHER): Payer: Self-pay

## 2022-09-06 ENCOUNTER — Telehealth: Payer: Self-pay | Admitting: *Deleted

## 2022-09-06 DIAGNOSIS — E7800 Pure hypercholesterolemia, unspecified: Secondary | ICD-10-CM

## 2022-09-06 DIAGNOSIS — R7303 Prediabetes: Secondary | ICD-10-CM

## 2022-09-06 NOTE — Telephone Encounter (Signed)
-----   Message from Lovena Neighbours sent at 09/06/2022  1:52 PM EDT ----- Regarding: Labs for 9.4.24 Please put physical lab orders in future. Thank you, Denny Peon

## 2022-09-21 ENCOUNTER — Other Ambulatory Visit (INDEPENDENT_AMBULATORY_CARE_PROVIDER_SITE_OTHER): Payer: No Typology Code available for payment source

## 2022-09-21 DIAGNOSIS — R7303 Prediabetes: Secondary | ICD-10-CM | POA: Diagnosis not present

## 2022-09-21 DIAGNOSIS — E7800 Pure hypercholesterolemia, unspecified: Secondary | ICD-10-CM

## 2022-09-21 LAB — COMPREHENSIVE METABOLIC PANEL
ALT: 83 U/L — ABNORMAL HIGH (ref 0–35)
AST: 49 U/L — ABNORMAL HIGH (ref 0–37)
Albumin: 4.2 g/dL (ref 3.5–5.2)
Alkaline Phosphatase: 83 U/L (ref 39–117)
BUN: 15 mg/dL (ref 6–23)
CO2: 28 meq/L (ref 19–32)
Calcium: 9 mg/dL (ref 8.4–10.5)
Chloride: 106 meq/L (ref 96–112)
Creatinine, Ser: 0.76 mg/dL (ref 0.40–1.20)
GFR: 88.51 mL/min (ref >60.00)
Glucose, Bld: 117 mg/dL — ABNORMAL HIGH (ref 70–99)
Potassium: 4.1 meq/L (ref 3.5–5.1)
Sodium: 141 meq/L (ref 135–145)
Total Bilirubin: 0.8 mg/dL (ref 0.2–1.2)
Total Protein: 6.9 g/dL (ref 6.0–8.3)

## 2022-09-21 LAB — LIPID PANEL
Cholesterol: 170 mg/dL (ref 0–200)
HDL: 33.2 mg/dL — ABNORMAL LOW (ref >39.00)
LDL Cholesterol: 84 mg/dL (ref 0–99)
NonHDL: 136.77
Total CHOL/HDL Ratio: 5
Triglycerides: 262 mg/dL — ABNORMAL HIGH (ref 0.0–149.0)
VLDL: 52.4 mg/dL — ABNORMAL HIGH (ref 0.0–40.0)

## 2022-09-21 LAB — HEMOGLOBIN A1C: Hgb A1c MFr Bld: 5.6 % (ref 4.6–6.5)

## 2022-09-21 NOTE — Progress Notes (Signed)
No critical labs need to be addressed urgently. We will discuss labs in detail at upcoming office visit.   

## 2022-09-28 ENCOUNTER — Encounter: Payer: No Typology Code available for payment source | Admitting: Family Medicine

## 2022-10-10 ENCOUNTER — Other Ambulatory Visit: Payer: Self-pay | Admitting: Family Medicine

## 2022-10-10 DIAGNOSIS — Z1231 Encounter for screening mammogram for malignant neoplasm of breast: Secondary | ICD-10-CM

## 2022-10-12 ENCOUNTER — Ambulatory Visit: Payer: No Typology Code available for payment source | Admitting: Family Medicine

## 2022-10-12 ENCOUNTER — Encounter: Payer: Self-pay | Admitting: Family Medicine

## 2022-10-12 VITALS — BP 120/84 | HR 78 | Temp 98.4°F | Ht 63.0 in | Wt 178.0 lb

## 2022-10-12 DIAGNOSIS — Z23 Encounter for immunization: Secondary | ICD-10-CM | POA: Diagnosis not present

## 2022-10-12 DIAGNOSIS — R600 Localized edema: Secondary | ICD-10-CM

## 2022-10-12 DIAGNOSIS — I83813 Varicose veins of bilateral lower extremities with pain: Secondary | ICD-10-CM

## 2022-10-12 DIAGNOSIS — Z0000 Encounter for general adult medical examination without abnormal findings: Secondary | ICD-10-CM

## 2022-10-12 DIAGNOSIS — F411 Generalized anxiety disorder: Secondary | ICD-10-CM

## 2022-10-12 DIAGNOSIS — E7800 Pure hypercholesterolemia, unspecified: Secondary | ICD-10-CM

## 2022-10-12 DIAGNOSIS — R7989 Other specified abnormal findings of blood chemistry: Secondary | ICD-10-CM

## 2022-10-12 DIAGNOSIS — R7303 Prediabetes: Secondary | ICD-10-CM

## 2022-10-12 MED ORDER — VENLAFAXINE HCL ER 37.5 MG PO CP24: ORAL_CAPSULE | ORAL | 5 refills | Status: DC

## 2022-10-12 NOTE — Assessment & Plan Note (Signed)
Acute worsening, most likely secondary to fatty liver.  She has had this evaluated in the past. Encouraged her to work on regular exercise low-fat low-carb diet.  We will reevaluate when she returns for follow-up in 1 month with hepatic panel and hepatitis screen.  We will move forward with ultrasound to reevaluate liver.  No Tylenol use, no alcohol use and no family history of liver problems.

## 2022-10-12 NOTE — Progress Notes (Signed)
Patient ID: Janet Porter, female    DOB: [birth year], 57 y.o.   MRN: [id number]  This visit was conducted in person.  BP 120/84 (BP Location: Left Arm, Patient Position: Sitting, Cuff Size: Normal)   Pulse 78   Temp 98.4 F (36.9 C) (Temporal)   Ht 5\' 3"  (1.6 m)   Wt 178 lb (80.7 kg)   SpO2 97%   BMI 31.53 kg/m    CC:  Chief Complaint  Patient presents with   Annual Exam    Subjective:   HPI: Janet Porter is a 57 y.o. female presenting on 10/12/2022 for Annual Exam  Elevated Cholesterol: LDL at goal less than 100 on simvastatin 20 mg p.o. daily Lab Results  Component Value Date   CHOL 170 09/21/2022   HDL 33.20 (L) 09/21/2022   LDLCALC 84 09/21/2022   LDLDIRECT 104.0 09/06/2021   TRIG 262.0 (H) 09/21/2022   CHOLHDL 5 09/21/2022  Using medications without problems: none Muscle aches:  none Diet compliance: low cholesterol in diet occ Exercise: Other complaints:  Hx of fatty liver disease   GAD: Controlled on Celexa 20 mg p.o. daily Some increase in stress  some incrase in stress.  She feels that it could be causing weight loss. Flowsheet Row Office Visit from 10/12/2022 in [facility] [place]  PHQ-2 Total Score 2         Prediabetes:  Lab Results  Component Value Date   HGBA1C 5.6 09/21/2022    BP Readings from Last 3 Encounters:  10/12/22 120/84  03/16/22 137/82  09/10/21 120/80   Wt Readings from Last 3 Encounters:  10/12/22 178 lb (80.7 kg)  03/16/22 177 lb (80.3 kg)  09/10/21 173 lb 8 oz (78.7 kg)       10,000 steps a day very active.    Liver function tests slightly elevated... no Tylenol use, no ETOH. No family history of liver issues.  Negative hep C testing.  Hx of fatty liver sen in 2015 MRI abdomen  Relevant past medical, surgical, family and social history reviewed and updated as indicated. Interim medical history since our last visit reviewed. Allergies and medications reviewed and  updated. Outpatient Medications Prior to Visit  Medication Sig Dispense Refill   simvastatin (ZOCOR) 20 MG tablet TAKE 1 TABLET BY MOUTH EVERYDAY AT BEDTIME 90 tablet 0   citalopram (CELEXA) 20 MG tablet TAKE 1 TABLET BY MOUTH EVERY DAY 90 tablet 0   No facility-administered medications prior to visit.     Per HPI unless specifically indicated in ROS section below Review of Systems  Constitutional:  Negative for fatigue and fever.  HENT:  Negative for congestion.   Eyes:  Negative for pain.  Respiratory:  Negative for cough and shortness of breath.   Cardiovascular:  Negative for chest pain, palpitations and leg swelling.  Gastrointestinal:  Negative for abdominal pain.  Genitourinary:  Negative for dysuria and vaginal bleeding.  Musculoskeletal:  Negative for back pain.  Neurological:  Negative for syncope, light-headedness and headaches.  Psychiatric/Behavioral:  Negative for dysphoric mood.    Objective:  BP 120/84 (BP Location: Left Arm, Patient Position: Sitting, Cuff Size: Normal)   Pulse 78   Temp 98.4 F (36.9 C) (Temporal)   Ht 5\' 3"  (1.6 m)   Wt 178 lb (80.7 kg)   SpO2 97%   BMI 31.53 kg/m   Wt Readings from Last 3 Encounters:  10/12/22 178 lb (80.7 kg)  03/16/22 177 lb (  80.3 kg)  09/10/21 173 lb 8 oz (78.7 kg)      Physical Exam Vitals and nursing note reviewed.  Constitutional:      General: She is not in acute distress.    Appearance: Normal appearance. She is well-developed. She is not ill-appearing or toxic-appearing.  HENT:     Head: Normocephalic.     Right Ear: Hearing, tympanic membrane, ear canal and external ear normal.     Left Ear: Hearing, tympanic membrane, ear canal and external ear normal.     Nose: Nose normal.  Eyes:     General: Lids are normal. Lids are everted, no foreign bodies appreciated.     Conjunctiva/sclera: Conjunctivae normal.     Pupils: Pupils are equal, round, and reactive to light.  Neck:     Thyroid: No thyroid mass or  thyromegaly.     Vascular: No carotid bruit.     Trachea: Trachea normal.  Cardiovascular:     Rate and Rhythm: Normal rate and regular rhythm.     Heart sounds: Normal heart sounds, S1 normal and S2 normal. No murmur heard.    No gallop.  Pulmonary:     Effort: Pulmonary effort is normal. No respiratory distress.     Breath sounds: Normal breath sounds. No wheezing, rhonchi or rales.  Abdominal:     General: Bowel sounds are normal. There is no distension or abdominal bruit.     Palpations: Abdomen is soft. There is no fluid wave or mass.     Tenderness: There is no abdominal tenderness. There is no guarding or rebound.     Hernia: No hernia is present.  Musculoskeletal:     Cervical back: Normal range of motion and neck supple.  Lymphadenopathy:     Cervical: No cervical adenopathy.  Skin:    General: Skin is warm and dry.     Findings: No rash.  Neurological:     Mental Status: She is alert.     Cranial Nerves: No cranial nerve deficit.     Sensory: No sensory deficit.  Psychiatric:        Mood and Affect: Mood is not anxious or depressed.        Speech: Speech normal.        Behavior: Behavior normal. Behavior is cooperative.        Judgment: Judgment normal.       Results for orders placed or performed in visit on 09/21/22  Lipid panel  Result Value Ref Range   Cholesterol 170 0 - 200 mg/dL   Triglycerides 409.8 (H) 0.0 - 149.0 mg/dL   HDL 11.91 (L) >47.82 mg/dL   VLDL 95.6 (H) 0.0 - 21.3 mg/dL   LDL Cholesterol 84 0 - 99 mg/dL   Total CHOL/HDL Ratio 5    NonHDL 136.77   Hemoglobin A1c  Result Value Ref Range   Hgb A1c MFr Bld 5.6 4.6 - 6.5 %  Comprehensive metabolic panel  Result Value Ref Range   Sodium 141 135 - 145 mEq/L   Potassium 4.1 3.5 - 5.1 mEq/L   Chloride 106 96 - 112 mEq/L   CO2 28 19 - 32 mEq/L   Glucose, Bld 117 (H) 70 - 99 mg/dL   BUN 15 6 - 23 mg/dL   Creatinine, Ser 0.86 0.40 - 1.20 mg/dL   Total Bilirubin 0.8 0.2 - 1.2 mg/dL   Alkaline  Phosphatase 83 39 - 117 U/L   AST 49 (H) 0 - 37 U/L  ALT 83 (H) 0 - 35 U/L   Total Protein 6.9 6.0 - 8.3 g/dL   Albumin 4.2 3.5 - 5.2 g/dL   GFR 09.81 >19.14 mL/min   Calcium 9.0 8.4 - 10.5 mg/dL     COVID 19 screen:  No recent travel or known exposure to COVID19 The patient denies respiratory symptoms of COVID 19 at this time. The importance of social distancing was discussed today.   Assessment and Plan   The patient's preventative maintenance and recommended screening tests for an annual wellness exam were reviewed in full today. Brought up to date unless services declined.  Counselled on the importance of diet, exercise, and its role in overall health and mortality. The patient's FH and SH was reviewed, including their home life, tobacco status, and drug and alcohol status.    Vaccines:  Uptodate  td. Completed Shingrix  and given flu vaccine.  COVID x 3 Pap/DVE: 02/2022 with GYN, Dr. Shawnie Pons Mammo:  09/2021.. scheduled for 10/2022 Colon:  02/16/2018 colonoscopy sessile polyp, family history, repeat 5 years per Dr. Orvan Falconer Smoking Status: none ETOH/ drug use: none/none HIV screen:   refused Hep c: done  Problem List Items Addressed This Visit     Elevated liver function tests    Acute worsening, most likely secondary to fatty liver.  She has had this evaluated in the past. Encouraged her to work on regular exercise low-fat low-carb diet.  We will reevaluate when she returns for follow-up in 1 month with hepatic panel and hepatitis screen.  We will move forward with ultrasound to reevaluate liver.  No Tylenol use, no alcohol use and no family history of liver problems.      Relevant Orders   US Abdomen Limited RUQ (LIVER/GB)   Hepatitis panel, acute   Hepatic Function Panel   Generalized anxiety disorder    Chronic, well-controlled but she is concerned about possible side effect of weight gain with Celexa.  We will try a trial of transition to venlafaxine 37.5 mg p.o.  daily x 1 week, then increase to 2 tablets p.o. daily.  Follow-up mood in 1 month.      Relevant Medications   venlafaxine XR (EFFEXOR-XR) 37.5 MG 24 hr capsule   HYPERCHOLESTEROLEMIA    Chronic, LDL at goal less than 100 on rosuvastatin 20 mg daily.  Triglycerides again elevated.  She will work on low-fat low-cholesterol heart healthy diet.  Increase exercise as tolerated.  Simvastatin 20 mg p.o. daily      Prediabetes    Chronic, stable.      Other Visit Diagnoses     Routine general medical examination at a health care facility    -  Primary   Encounter for immunization       Relevant Orders   Flu vaccine trivalent PF, 6mos and older(Flulaval,Afluria,Fluarix,Fluzone) (Completed)   Peripheral edema       Relevant Orders   TSH   Varicose veins of both lower extremities with pain       Relevant Orders   Ambulatory referral to Vascular Surgery        Kerby Nora, MD

## 2022-10-12 NOTE — Assessment & Plan Note (Signed)
Chronic, well-controlled but she is concerned about possible side effect of weight gain with Celexa.  We will try a trial of transition to venlafaxine 37.5 mg p.o. daily x 1 week, then increase to 2 tablets p.o. daily.  Follow-up mood in 1 month.

## 2022-10-12 NOTE — Assessment & Plan Note (Signed)
Chronic, LDL at goal less than 100 on rosuvastatin 20 mg daily.  Triglycerides again elevated.  She will work on low-fat low-cholesterol heart healthy diet.  Increase exercise as tolerated.  Simvastatin 20 mg p.o. daily

## 2022-10-12 NOTE — Assessment & Plan Note (Signed)
Chronic, stable

## 2022-10-12 NOTE — Patient Instructions (Addendum)
Stop celexa change to venlafaxine.  Elevated legs, wear compression hose.  We will call you about vascular referral.

## 2022-10-19 ENCOUNTER — Ambulatory Visit
Admission: RE | Admit: 2022-10-19 | Discharge: 2022-10-19 | Disposition: A | Discharge status: Home / Self Care | Payer: No Typology Code available for payment source | Source: Ambulatory Visit | Attending: Family Medicine | Admitting: Family Medicine

## 2022-10-19 DIAGNOSIS — Z1231 Encounter for screening mammogram for malignant neoplasm of breast: Secondary | ICD-10-CM

## 2022-10-20 ENCOUNTER — Ambulatory Visit
Admission: RE | Admit: 2022-10-20 | Discharge: 2022-10-20 | Disposition: A | Discharge status: Home / Self Care | Payer: No Typology Code available for payment source | Source: Ambulatory Visit | Attending: Family Medicine | Admitting: Family Medicine

## 2022-10-20 DIAGNOSIS — R7989 Other specified abnormal findings of blood chemistry: Secondary | ICD-10-CM

## 2022-10-27 ENCOUNTER — Other Ambulatory Visit: Payer: Self-pay | Admitting: Family Medicine

## 2022-11-02 ENCOUNTER — Other Ambulatory Visit (INDEPENDENT_AMBULATORY_CARE_PROVIDER_SITE_OTHER): Payer: No Typology Code available for payment source

## 2022-11-02 DIAGNOSIS — R600 Localized edema: Secondary | ICD-10-CM | POA: Diagnosis not present

## 2022-11-02 DIAGNOSIS — R7989 Other specified abnormal findings of blood chemistry: Secondary | ICD-10-CM | POA: Diagnosis not present

## 2022-11-02 LAB — HEPATIC FUNCTION PANEL
ALT: 78 U/L — ABNORMAL HIGH (ref 0–35)
AST: 48 U/L — ABNORMAL HIGH (ref 0–37)
Albumin: 4.6 g/dL (ref 3.5–5.2)
Alkaline Phosphatase: 94 U/L (ref 39–117)
Bilirubin, Direct: 0.2 mg/dL (ref 0.0–0.3)
Total Bilirubin: 1.1 mg/dL (ref 0.2–1.2)
Total Protein: 7 g/dL (ref 6.0–8.3)

## 2022-11-02 LAB — TSH: TSH: 3.22 u[IU]/mL (ref 0.35–5.50)

## 2022-11-03 LAB — HEPATITIS PANEL, ACUTE
Hep A IgM: NONREACTIVE
Hep B C IgM: NONREACTIVE
Hepatitis B Surface Ag: NONREACTIVE
Hepatitis C Ab: NONREACTIVE

## 2022-11-03 NOTE — Progress Notes (Signed)
No critical labs need to be addressed urgently. We will discuss labs in detail at upcoming office visit.   

## 2022-11-04 ENCOUNTER — Other Ambulatory Visit: Payer: Self-pay | Admitting: Family Medicine

## 2022-11-09 ENCOUNTER — Ambulatory Visit: Payer: No Typology Code available for payment source | Admitting: Family Medicine

## 2022-11-09 ENCOUNTER — Encounter: Payer: Self-pay | Admitting: Family Medicine

## 2022-11-09 VITALS — BP 132/74 | HR 81 | Temp 97.9°F | Ht 63.0 in | Wt 179.0 lb

## 2022-11-09 DIAGNOSIS — K760 Fatty (change of) liver, not elsewhere classified: Secondary | ICD-10-CM | POA: Diagnosis not present

## 2022-11-09 DIAGNOSIS — R7989 Other specified abnormal findings of blood chemistry: Secondary | ICD-10-CM | POA: Diagnosis not present

## 2022-11-09 DIAGNOSIS — K828 Other specified diseases of gallbladder: Secondary | ICD-10-CM

## 2022-11-09 DIAGNOSIS — F411 Generalized anxiety disorder: Secondary | ICD-10-CM

## 2022-11-09 MED ORDER — VENLAFAXINE HCL ER 75 MG PO CP24: 75.0000 mg | ORAL_CAPSULE | Freq: Every day | ORAL | 1 refills | Status: DC

## 2022-11-09 NOTE — Assessment & Plan Note (Signed)
Gallbladder distention noted on ultrasound right upper quadrant.  Patient does not have right upper quadrant pain.  No stones or sludge noted.  She does have some upset stomach after eating certain foods.  Consider further evaluation per GI

## 2022-11-09 NOTE — Progress Notes (Signed)
Patient ID: Janet Porter, female    DOB: [birth year], 57 y.o.   MRN: [id number]  This visit was conducted in person.  BP 132/74   Pulse 81   Temp 97.9 F (36.6 C) (Oral)   Ht 5\' 3"  (1.6 m)   Wt 179 lb (81.2 kg)   SpO2 93%   BMI 31.71 kg/m    CC:  Chief Complaint  Patient presents with   Follow-up     Mood and LFts    Subjective:   HPI: Janet Porter is a 57 y.o. female presenting on 11/09/2022 for Follow-up ( Mood and LFts)   At last OV: Elevated liver function tests       Acute worsening, most likely secondary to fatty liver.  She has had this evaluated in the past. Encouraged her to work on regular exercise low-fat low-carb diet.  We will reevaluate when she returns for follow-up in 1 month with hepatic panel and hepatitis screen.  We will move forward with ultrasound to reevaluate liver.   No Tylenol use, no alcohol use and no family history of liver problems.        Relevant Orders    US Abdomen Limited RUQ (LIVER/GB)    Hepatitis panel, acute    Hepatic Function Panel    Generalized anxiety disorder      Chronic, well-controlled but she is concerned about possible side effect of weight gain with Celexa.  We will try a trial of transition to venlafaxine 37.5 mg p.o. daily x 1 week, then increase to 2 tablets p.o. daily.  Follow-up mood in 1 month.       Today she reports   GAD:  Minimal improvement in anxiety with change to venlafaxine but GAD7 50% better,  she has not increased yet to 75 mg daily  She minimal improvement.. no SE, occ HA      11/09/2022    9:02 AM 10/12/2022   12:10 PM 11/30/2017    8:51 AM 09/27/2016    9:38 AM  GAD 7 : Generalized Anxiety Score  Nervous, Anxious, on Edge 1 2 1 1   Control/stop worrying 1 2 1 1   Worry too much - different things 1 2 1  0  Trouble relaxing 1 2 0 0  Restless 1 1 1  0  Easily annoyed or irritable 1 1 1 1   Afraid - awful might happen 1 2 0 0  Total GAD 7 Score 7 12 5 3   Anxiety Difficulty Somewhat  difficult Somewhat difficult Not difficult at all Somewhat difficult   Flowsheet Row Office Visit from 11/09/2022 in [facility] at [address]  PHQ-2 Total Score 2        Hepatic panel negative and repeat liver function tests showed no change in  AST/ALT 48/78 respectively. US showed : Distended gallbladder.  No stones or sludge.  Fatty liver infiltration.  Hypoechoic lesion identified measuring 3.2 cm. Although this could be an increase in size of previous described hemangioma it is difficult to confirm this has the same lesion with size change. Confirmatory contrast exam could be performed when clinically Appropriate  She denies RUQ pain.  She has been working on low fat low cholesterol diet.  Relevant past medical, surgical, family and social history reviewed and updated as indicated. Interim medical history since our last visit reviewed. Allergies and medications reviewed and updated. Outpatient Medications Prior to Visit  Medication Sig Dispense Refill   simvastatin (ZOCOR) 20 MG tablet TAKE  1 TABLET BY MOUTH EVERYDAY AT BEDTIME 90 tablet 0   venlafaxine XR (EFFEXOR-XR) 37.5 MG 24 hr capsule Take 1 capsule (37.5 mg total) by mouth daily. TAKE 1 TABLET DAILY, IF TOLERATING INCREASE TO 2 TABS DAILY. 90 capsule 0   No facility-administered medications prior to visit.     Per HPI unless specifically indicated in ROS section below Review of Systems  Constitutional:  Negative for fatigue and fever.  HENT:  Negative for congestion.   Eyes:  Negative for pain.  Respiratory:  Negative for cough and shortness of breath.   Cardiovascular:  Negative for chest pain, palpitations and leg swelling.  Gastrointestinal:  Negative for abdominal pain.  Genitourinary:  Negative for dysuria and vaginal bleeding.  Musculoskeletal:  Negative for back pain.  Neurological:  Negative for syncope, light-headedness and headaches.  Psychiatric/Behavioral:  Negative for dysphoric  mood.    Objective:  BP 132/74   Pulse 81   Temp 97.9 F (36.6 C) (Oral)   Ht 5\' 3"  (1.6 m)   Wt 179 lb (81.2 kg)   SpO2 93%   BMI 31.71 kg/m   Wt Readings from Last 3 Encounters:  11/09/22 179 lb (81.2 kg)  10/12/22 178 lb (80.7 kg)  03/16/22 177 lb (80.3 kg)      Physical Exam Constitutional:      General: She is not in acute distress.    Appearance: Normal appearance. She is well-developed. She is not ill-appearing or toxic-appearing.  HENT:     Head: Normocephalic.     Right Ear: Hearing, tympanic membrane, ear canal and external ear normal. Tympanic membrane is not erythematous, retracted or bulging.     Left Ear: Hearing, tympanic membrane, ear canal and external ear normal. Tympanic membrane is not erythematous, retracted or bulging.     Nose: No mucosal edema or rhinorrhea.     Right Sinus: No maxillary sinus tenderness or frontal sinus tenderness.     Left Sinus: No maxillary sinus tenderness or frontal sinus tenderness.     Mouth/Throat:     Mouth: Oropharynx is clear and moist and mucous membranes are normal.     Pharynx: Uvula midline.  Eyes:     General: Lids are normal. Lids are everted, no foreign bodies appreciated.     Extraocular Movements: EOM normal.     Conjunctiva/sclera: Conjunctivae normal.     Pupils: Pupils are equal, round, and reactive to light.  Neck:     Thyroid: No thyroid mass or thyromegaly.     Vascular: No carotid bruit.     Trachea: Trachea normal.  Cardiovascular:     Rate and Rhythm: Normal rate and regular rhythm.     Pulses: Normal pulses.     Heart sounds: Normal heart sounds, S1 normal and S2 normal. No murmur heard.    No friction rub. No gallop.  Pulmonary:     Effort: Pulmonary effort is normal. No tachypnea or respiratory distress.     Breath sounds: Normal breath sounds. No decreased breath sounds, wheezing, rhonchi or rales.  Abdominal:     General: Bowel sounds are normal.     Palpations: Abdomen is soft.      Tenderness: There is no abdominal tenderness.  Musculoskeletal:     Cervical back: Normal range of motion and neck supple.  Skin:    General: Skin is warm, dry and intact.     Findings: No rash.  Neurological:     Mental Status: She is alert.  Psychiatric:  Mood and Affect: Mood is not anxious or depressed.        Speech: Speech normal.        Behavior: Behavior normal. Behavior is cooperative.        Thought Content: Thought content normal.        Cognition and Memory: Cognition and memory normal.        Judgment: Judgment normal.       Results for orders placed or performed in visit on 11/02/22  TSH  Result Value Ref Range   TSH 3.22 0.35 - 5.50 uIU/mL  Hepatic Function Panel  Result Value Ref Range   Total Bilirubin 1.1 0.2 - 1.2 mg/dL   Bilirubin, Direct 0.2 0.0 - 0.3 mg/dL   Alkaline Phosphatase 94 39 - 117 U/L   AST 48 (H) 0 - 37 U/L   ALT 78 (H) 0 - 35 U/L   Total Protein 7.0 6.0 - 8.3 g/dL   Albumin 4.6 3.5 - 5.2 g/dL  Hepatitis panel, acute  Result Value Ref Range   Hep A IgM NON-REACTIVE NON-REACTIVE   Hepatitis B Surface Ag NON-REACTIVE NON-REACTIVE   Hep B C IgM NON-REACTIVE NON-REACTIVE   Hepatitis C Ab NON-REACTIVE NON-REACTIVE    Assessment and Plan  Elevated liver function tests Assessment & Plan: Chronic, most likely due to fatty liver.  No cirrhosis noted on ultrasound. No family history of liver issues no alcohol use no Tylenol use. Given persistence of elevated LFTs despite attempts at weight loss and low-fat diet with dietary changes..  I will move forward with referral to GI for further evaluation. Also of note change seen on ultrasound possibly consistent with previously demonstrated hemangioma, but will welcome GI recommendations for possible CT scan.  Orders: -     Ambulatory referral to Gastroenterology  Fatty liver Assessment & Plan: Chronic, continue working on regular exercise weight management and low-fat diet. We did discuss  possible medication use to facilitate weight management including GLP-1 medication.  Stimulant medications are not recommended in her given her history of palpitations and generalized anxiety.  Orders: -     Ambulatory referral to Gastroenterology  Generalized anxiety disorder Assessment & Plan: Chronic, inadequate control with venlafaxine 37.5 mg XR daily.  Will have her increase to 75 mg daily.   Gallbladder dilatation Assessment & Plan: Gallbladder distention noted on ultrasound right upper quadrant.  Patient does not have right upper quadrant pain.  No stones or sludge noted.  She does have some upset stomach after eating certain foods.  Consider further evaluation per GI   Other orders -     Venlafaxine HCl ER; Take 1 capsule (75 mg total) by mouth daily. TAKE 1 TABLET DAILY, IF TOLERATING INCREASE TO 2 TABS DAILY.  Dispense: 90 capsule; Refill: 1    No follow-ups on file.   Kerby Nora, MD

## 2022-11-09 NOTE — Assessment & Plan Note (Signed)
Chronic, continue working on regular exercise weight management and low-fat diet. We did discuss possible medication use to facilitate weight management including GLP-1 medication.  Stimulant medications are not recommended in her given her history of palpitations and generalized anxiety.

## 2022-11-09 NOTE — Assessment & Plan Note (Signed)
Chronic, most likely due to fatty liver.  No cirrhosis noted on ultrasound. No family history of liver issues no alcohol use no Tylenol use. Given persistence of elevated LFTs despite attempts at weight loss and low-fat diet with dietary changes..  I will move forward with referral to GI for further evaluation. Also of note change seen on ultrasound possibly consistent with previously demonstrated hemangioma, but will welcome GI recommendations for possible CT scan.

## 2022-11-09 NOTE — Patient Instructions (Addendum)
Increase venlafaxine to 75 mg daily.  Continue to work on low fat, low cholesterol diet.  We will move forward with GI referral for liver issues.  Can look into coverage of GLP1 medications.

## 2022-11-09 NOTE — Assessment & Plan Note (Signed)
Chronic, inadequate control with venlafaxine 37.5 mg XR daily.  Will have her increase to 75 mg daily.

## 2022-11-10 ENCOUNTER — Encounter: Payer: Self-pay | Admitting: Nurse Practitioner

## 2022-11-10 ENCOUNTER — Encounter: Payer: Self-pay | Admitting: Family Medicine

## 2022-11-15 MED ORDER — WEGOVY 0.25 MG/0.5ML ~~LOC~~ SOAJ: 0.2500 mg | SUBCUTANEOUS | 0 refills | Status: DC

## 2022-11-15 NOTE — Telephone Encounter (Signed)
She is probably referring to a PA.  If that is the case, then a medication would need to be prescribed first before a PA can be submitted to insurance.

## 2022-11-18 ENCOUNTER — Other Ambulatory Visit: Payer: Self-pay | Admitting: *Deleted

## 2022-11-18 DIAGNOSIS — I8393 Asymptomatic varicose veins of bilateral lower extremities: Secondary | ICD-10-CM

## 2022-11-22 NOTE — Progress Notes (Unsigned)
Office Note     CC: Bilateral lower extremity varicose veins Requesting Provider:  Excell Seltzer, MD  HPI: Janet Porter is a 57 y.o. (1965/05/03) female who presents at the request of [name], [name], MD for evaluation of bilateral lower extremity varicose veins.  On exam, Janet Porter was doing well.  Originally from [place], she moved to [place] with her husband for his job over 15 years ago.  They have 3 children, all in their 14s, one of which is still in college at [employer].  Janet Porter has a history of greater saphenous vein ablation which was performed in [place].  She presents today to discuss some swelling she appreciates at the right ankle.  Janet Porter continues to work full-time at [employer], and is on her feet for most of the day.  She notes heavy, tired feeling by days end in bilateral lower extremities with swelling most appreciated at the right ankle.  She denies bleeding or ulceration.  She does appreciate some tenderness at the site of cluster varicosities.  [name] does not wear compression stockings.  No history of DVT. Denies symptoms of claudication, ischemic rest pain, tissue loss.  Past Medical History:  Diagnosis Date   Abnormal Pap smear 2005   Anxiety state, unspecified    Atypical nevus 11/02/2007   R post upper arm - excision 12/05/2007   Benign neoplasm of skin, site unspecified    Cancer (HCC)    skin cancer    Depressive disorder, not elsewhere classified    Dysplastic nevus 10/27/2006   L med knee - mild   Dysplastic nevus 11/25/2008   back - mild   Esophageal reflux    pt does not take meds   Headache(784.0)    Other specified respiratory tuberculosis, tubercle bacilli not found by bacteriological examination, but tuberculosis confirmed histologically    Pure hypercholesterolemia    Unspecified sinusitis (chronic)     Past Surgical History:  Procedure Laterality Date   ADENOIDECTOMY     COLPOSCOPY  2000   abn pap   DILITATION &  CURRETTAGE/HYSTROSCOPY WITH NOVASURE ABLATION N/A 07/13/2012   Procedure: DILATATION & CURETTAGE/HYSTEROSCOPY WITH NOVASURE ABLATION;  Surgeon: Allie Bossier, MD;  Location: [facility];  Service: Gynecology;  Laterality: N/A;   TONSILLECTOMY  1979   TONSILLECTOMY     VARICOSE VEIN SURGERY  2000   VARICOSE VEIN SURGERY      Social History   Socioeconomic History   Marital status: Married    Spouse name: Not on file   Number of children: 3   Years of education: Not on file   Highest education level: Bachelor's degree (Porter.g., BA, AB, BS)  Occupational History   Occupation: Homemaker  Tobacco Use   Smoking status: Never   Smokeless tobacco: Never  Vaping Use   Vaping status: Never Used  Substance and Sexual Activity   Alcohol use: No   Drug use: No   Sexual activity: Yes    Partners: Male    Birth control/protection: Condom  Other Topics Concern   Not on file  Social History Narrative   Walks daily      Married x 13 years, no abuse      3 kids; ages 3-10      Homemaker      3 meals, limited fruits and veggies; sweet iced tea   Social Determinants of Health   Financial Resource Strain: Low Risk  (11/08/2022)   Overall Financial Resource Strain (CARDIA)    Difficulty  of Paying Living Expenses: Not hard at all  Food Insecurity: No Food Insecurity (11/08/2022)   Hunger Vital Sign    Worried About Running Out of Food in the Last Year: Never true    Ran Out of Food in the Last Year: Never true  Transportation Needs: No Transportation Needs (11/08/2022)   PRAPARE - Administrator, Civil Service (Medical): No    Lack of Transportation (Non-Medical): No  Physical Activity: Sufficiently Active (11/08/2022)   Exercise Vital Sign    Days of Exercise per Week: 4 days    Minutes of Exercise per Session: 40 min  Stress: No Stress Concern Present (11/08/2022)   [employer] of Occupational Health - Occupational Stress Questionnaire    Feeling of Stress : Only a little   Social Connections: Moderately Integrated (11/08/2022)   Social Connection and Isolation Panel [NHANES]    Frequency of Communication with Friends and Family: More than three times a week    Frequency of Social Gatherings with Friends and Family: Twice a week    Attends Religious Services: More than 4 times per year    Active Member of [employer] or Organizations: No    Attends Engineer, structural: Not on file    Marital Status: Married  Catering manager Violence: Not on file    Family History  Problem Relation Age of Onset   Hypertension Father    Diabetes Father    Hyperlipidemia Father    Healthy Mother    Colon polyps Mother    Healthy Brother    Pancreatic cancer Paternal Grandfather    Cancer Maternal Grandfather        Oral   Coronary artery disease Maternal Grandmother        Stents   Colon cancer Neg Hx    Esophageal cancer Neg Hx    Rectal cancer Neg Hx    Stomach cancer Neg Hx     Current Outpatient Medications  Medication Sig Dispense Refill   Semaglutide-Weight Management (WEGOVY) 0.25 MG/0.5ML SOAJ Inject 0.25 mg into the skin once a week. 2 mL 0   simvastatin (ZOCOR) 20 MG tablet TAKE 1 TABLET BY MOUTH EVERYDAY AT BEDTIME 90 tablet 0   venlafaxine XR (EFFEXOR-XR) 75 MG 24 hr capsule Take 1 capsule (75 mg total) by mouth daily. TAKE 1 TABLET DAILY, IF TOLERATING INCREASE TO 2 TABS DAILY. 90 capsule 1   No current facility-administered medications for this visit.    No Known Allergies   REVIEW OF SYSTEMS:  [X]  denotes positive finding, [ ]  denotes negative finding Cardiac  Comments:  Chest pain or chest pressure:    Shortness of breath upon exertion:    Short of breath when lying flat:    Irregular heart rhythm:        Vascular    Pain in calf, thigh, or hip brought on by ambulation:    Pain in feet at night that wakes you up from your sleep:     Blood clot in your veins:    Leg swelling:         Pulmonary    Oxygen at home:    Productive  cough:     Wheezing:         Neurologic    Sudden weakness in arms or legs:     Sudden numbness in arms or legs:     Sudden onset of difficulty speaking or slurred speech:    Temporary loss of vision in one  eye:     Problems with dizziness:         Gastrointestinal    Blood in stool:     Vomited blood:         Genitourinary    Burning when urinating:     Blood in urine:        Psychiatric    Major depression:         Hematologic    Bleeding problems:    Problems with blood clotting too easily:        Skin    Rashes or ulcers:        Constitutional    Fever or chills:      PHYSICAL EXAMINATION:  There were no vitals filed for this visit.  General:  WDWN in NAD; vital signs documented above Gait: Not observed HENT: WNL, normocephalic Pulmonary: normal non-labored breathing , without Rales, rhonchi,  wheezing Cardiac: regular HR Abdomen: soft, NT, no masses Skin: without rashes Vascular Exam/Pulses:  Right Left  Radial 2+ (normal) 2+ (normal)  Ulnar    Femoral    Popliteal    DP 2+ (normal) 2+ (normal)  PT     Extremities: without ischemic changes, without Gangrene , without cellulitis; without open wounds;  Musculoskeletal: no muscle wasting or atrophy  Neurologic: A&O X 3;  No focal weakness or paresthesias are detected Psychiatric:  The pt has Normal affect.   Non-Invasive Vascular Imaging:   Venous Reflux Times  +--------------+--------+------+----------+------------+-------------------  ----+  RIGHT        Reflux  Reflux  Reflux  Diameter cmsComments                                No       Yes     Time                                         +--------------+--------+------+----------+------------+-------------------  ----+  CFV                   yes  >1 second                                       +--------------+--------+------+----------+------------+-------------------  ----+  FV mid                 yes  >1 second                                        +--------------+--------+------+----------+------------+-------------------  ----+  Popliteal    no                                                            +--------------+--------+------+----------+------------+-------------------  ----+  GSV at [facility]  prior                                                                       ablation/stripping        +--------------+--------+------+----------+------------+-------------------  ----+  GSV prox thigh                                    prior                                                                       ablation/stripping        +--------------+--------+------+----------+------------+-------------------  ----+  GSV mid thigh                                     prior                                                                       ablation/stripping        +--------------+--------+------+----------+------------+-------------------  ----+  GSV dist thigh                                    prior                                                                       ablation/stripping        +--------------+--------+------+----------+------------+-------------------  ----+  GSV at knee                                       prior                                                                       ablation/stripping        +--------------+--------+------+----------+------------+-------------------  ----+  SSV Pop Fossa no  0.31                              +--------------+--------+------+----------+------------+-------------------  ----+       ASSESSMENT/PLAN:: 57 y.o. female presenting with right ankle edema worse by days end. On physical exam, she had minimal edema at the ankle.  Bilateral lower extremities with mild adipose  deposits.  Cluster of varicosities appreciated, without concern for ulceration.  I had a long conversation with Arya regarding the above.  I think that the ankle swelling is a product of deep venous reflux as imaging demonstrated that the greater saphenous vein remains ablated.  She has no reflux in the SSV.  Median I had a nice conversation regarding the natural history of chronic venous insufficiency, notably how this is best managed with elevation and compression.  She is aware that I am not worried about her in the coming year, but more so in the coming decades as she is young.  At this time, there are no surgical interventions that I can offer.  She is aware that elevation and compression will provide the greatest benefit.  She plans to order new compressions off of [employer].  I asked her to call my office should any questions or concerns arise.   Victorino Sparrow, MD [facility] [phone]

## 2022-11-24 ENCOUNTER — Encounter: Payer: Self-pay | Admitting: Vascular Surgery

## 2022-11-24 ENCOUNTER — Ambulatory Visit (HOSPITAL_COMMUNITY)
Admission: RE | Admit: 2022-11-24 | Discharge: 2022-11-24 | Disposition: A | Discharge status: Home / Self Care | Payer: No Typology Code available for payment source | Source: Ambulatory Visit | Attending: Surgery | Admitting: Surgery

## 2022-11-24 ENCOUNTER — Ambulatory Visit: Payer: No Typology Code available for payment source | Admitting: Vascular Surgery

## 2022-11-24 VITALS — BP 121/76 | HR 88 | Temp 98.1°F | Resp 20 | Ht 63.0 in | Wt 180.0 lb

## 2022-11-24 DIAGNOSIS — I872 Venous insufficiency (chronic) (peripheral): Secondary | ICD-10-CM | POA: Diagnosis not present

## 2022-11-24 DIAGNOSIS — I8393 Asymptomatic varicose veins of bilateral lower extremities: Secondary | ICD-10-CM | POA: Diagnosis present

## 2022-12-02 ENCOUNTER — Encounter: Payer: Self-pay | Admitting: *Deleted

## 2022-12-02 ENCOUNTER — Other Ambulatory Visit (HOSPITAL_COMMUNITY): Payer: Self-pay

## 2022-12-02 ENCOUNTER — Telehealth: Payer: Self-pay

## 2022-12-02 NOTE — Telephone Encounter (Signed)
Pharmacy Patient Advocate Encounter  Received notification from [facility] that Prior Authorization for Wegovy 0.25MG /0.5ML auto-injectors has been APPROVED from 12/02/2022 to 06/30/2023. Ran test claim, Copay is $24.99. This test claim was processed through [facility] Porter- copay amounts may vary at other pharmacies due to pharmacy/plan contracts, or as the patient moves through the different stages of their insurance plan.   PA #/Case ID/Reference #: [id number]

## 2022-12-02 NOTE — Telephone Encounter (Signed)
Patient notified of PA approval for [facility] via MyChart.

## 2022-12-02 NOTE — Telephone Encounter (Signed)
Pharmacy Patient Advocate Encounter   Received notification from CoverMyMeds that prior authorization for [facility] is required/requested.   Insurance verification completed.   The patient is insured through [facility] .   Per test claim: PA required; PA submitted to above mentioned insurance via CoverMyMeds Key/confirmation #/EOC [id number] Status is pending

## 2022-12-29 ENCOUNTER — Other Ambulatory Visit: Payer: Self-pay | Admitting: Family Medicine

## 2022-12-30 ENCOUNTER — Other Ambulatory Visit: Payer: Self-pay | Admitting: Family Medicine

## 2022-12-30 NOTE — Telephone Encounter (Signed)
Call patient.  If she tolerating current dose of 0.25 mg weekly?  If so I would recommend increasing to 0.5 mg weekly.  Or if she has already increased to 0.5 we can increase to 1 mg,  Has she noted weight loss? document weights please.

## 2022-12-30 NOTE — Telephone Encounter (Signed)
Unable to reach patient. Left voicemail to return call to our office.   

## 2022-12-30 NOTE — Telephone Encounter (Signed)
Last Refill: 2mL W/ 0 refiils 11/15/22, Prior auth was approved on 12/02/22 LOV: 11/09/22 Nov: Not scheduled

## 2023-01-02 ENCOUNTER — Telehealth: Payer: Self-pay | Admitting: Family Medicine

## 2023-01-02 NOTE — Telephone Encounter (Signed)
See refill request encounter

## 2023-01-02 NOTE — Telephone Encounter (Signed)
Called patient and reviewed all information. Patient verbalized understanding.  She is having some slight side effects, nausea, headaches and some sleep disturbances. She notes these are not intolerable but would like to give the 0.25 mg dose another month to see if these improve at all. Patient states that she has not checked her weight since starting the [facility], she says she does not have a scale at home.  Will call if any further questions.

## 2023-01-02 NOTE — Telephone Encounter (Signed)
Pt called in and stated that she is returning a phone call and would like a call back

## 2023-01-27 ENCOUNTER — Encounter: Payer: Self-pay | Admitting: *Deleted

## 2023-01-27 ENCOUNTER — Other Ambulatory Visit: Payer: Self-pay | Admitting: Family Medicine

## 2023-01-28 ENCOUNTER — Other Ambulatory Visit: Payer: Self-pay | Admitting: Family Medicine

## 2023-01-31 NOTE — Telephone Encounter (Signed)
Prescription sent to your pharmacy.

## 2023-02-16 ENCOUNTER — Ambulatory Visit: Payer: No Typology Code available for payment source | Admitting: Nurse Practitioner

## 2023-03-17 ENCOUNTER — Other Ambulatory Visit (INDEPENDENT_AMBULATORY_CARE_PROVIDER_SITE_OTHER): Payer: No Typology Code available for payment source

## 2023-03-17 ENCOUNTER — Ambulatory Visit: Payer: No Typology Code available for payment source | Admitting: Gastroenterology

## 2023-03-17 ENCOUNTER — Encounter: Payer: Self-pay | Admitting: Gastroenterology

## 2023-03-17 VITALS — BP 108/76 | HR 95 | Ht 63.0 in | Wt 171.0 lb

## 2023-03-17 DIAGNOSIS — R935 Abnormal findings on diagnostic imaging of other abdominal regions, including retroperitoneum: Secondary | ICD-10-CM

## 2023-03-17 DIAGNOSIS — R748 Abnormal levels of other serum enzymes: Secondary | ICD-10-CM | POA: Diagnosis not present

## 2023-03-17 DIAGNOSIS — K5909 Other constipation: Secondary | ICD-10-CM

## 2023-03-17 DIAGNOSIS — K760 Fatty (change of) liver, not elsewhere classified: Secondary | ICD-10-CM

## 2023-03-17 DIAGNOSIS — R7989 Other specified abnormal findings of blood chemistry: Secondary | ICD-10-CM | POA: Diagnosis not present

## 2023-03-17 DIAGNOSIS — Z83719 Family history of colon polyps, unspecified: Secondary | ICD-10-CM

## 2023-03-17 LAB — CBC WITH DIFFERENTIAL/PLATELET
Basophils Absolute: 0 10*3/uL (ref 0.0–0.1)
Basophils Relative: 0.9 % (ref 0.0–3.0)
Eosinophils Absolute: 0.1 10*3/uL (ref 0.0–0.7)
Eosinophils Relative: 2.7 % (ref 0.0–5.0)
HCT: 44.2 % (ref 36.0–46.0)
Hemoglobin: 15 g/dL (ref 12.0–15.0)
Lymphocytes Relative: 32 % (ref 12.0–46.0)
Lymphs Abs: 1.7 10*3/uL (ref 0.7–4.0)
MCHC: 34 g/dL (ref 30.0–36.0)
MCV: 91.3 fl (ref 78.0–100.0)
Monocytes Absolute: 0.3 10*3/uL (ref 0.1–1.0)
Monocytes Relative: 6.5 % (ref 3.0–12.0)
Neutro Abs: 3.1 10*3/uL (ref 1.4–7.7)
Neutrophils Relative %: 57.9 % (ref 43.0–77.0)
Platelets: 265 10*3/uL (ref 150.0–400.0)
RBC: 4.84 Mil/uL (ref 3.87–5.11)
RDW: 12.6 % (ref 11.5–15.5)
WBC: 5.3 10*3/uL (ref 4.0–10.5)

## 2023-03-17 LAB — COMPREHENSIVE METABOLIC PANEL
ALT: 47 U/L — ABNORMAL HIGH (ref 0–35)
AST: 34 U/L (ref 0–37)
Albumin: 4.9 g/dL (ref 3.5–5.2)
Alkaline Phosphatase: 85 U/L (ref 39–117)
BUN: 10 mg/dL (ref 6–23)
CO2: 28 meq/L (ref 19–32)
Calcium: 9.7 mg/dL (ref 8.4–10.5)
Chloride: 104 meq/L (ref 96–112)
Creatinine, Ser: 0.82 mg/dL (ref 0.40–1.20)
GFR: 80.53 mL/min (ref >60.00)
Glucose, Bld: 96 mg/dL (ref 70–99)
Potassium: 3.9 meq/L (ref 3.5–5.1)
Sodium: 141 meq/L (ref 135–145)
Total Bilirubin: 0.8 mg/dL (ref 0.2–1.2)
Total Protein: 7.6 g/dL (ref 6.0–8.3)

## 2023-03-17 LAB — IBC + FERRITIN
Ferritin: 76.4 ng/mL (ref 10.0–291.0)
Iron: 80 ug/dL (ref 42–145)
Saturation Ratios: 22.1 % (ref 20.0–50.0)
TIBC: 362.6 ug/dL (ref 250.0–450.0)
Transferrin: 259 mg/dL (ref 212.0–360.0)

## 2023-03-17 LAB — TSH: TSH: 2.77 u[IU]/mL (ref 0.35–5.50)

## 2023-03-17 LAB — PROTIME-INR
INR: 1 ratio (ref 0.8–1.0)
Prothrombin Time: 11.1 s (ref 9.6–13.1)

## 2023-03-17 MED ORDER — SUFLAVE 178.7 G PO SOLR: 1.0000 | Freq: Once | ORAL | 0 refills | Status: AC

## 2023-03-17 NOTE — Patient Instructions (Signed)
 Your provider has requested that you go to the basement level for lab work before leaving today. Press "B" on the elevator. The lab is located at the first door on the left as you exit the elevator.  You have been scheduled for a CT scan of the abdomen and pelvis at [facility], 1st floor Radiology. You are scheduled on 03/24/23 at 3:30 pm. You should arrive 15 minutes prior to your appointment time for registration.   You may take any medications as prescribed with a small amount of water, if necessary. If you take any of the following medications: METFORMIN, GLUCOPHAGE, GLUCOVANCE, AVANDAMET, RIOMET, FORTAMET, ACTOPLUS MET, JANUMET, GLUMETZA or METAGLIP, you MAY be asked to HOLD this medication 48 hours AFTER the exam.   The purpose of you drinking the oral contrast is to aid in the visualization of your intestinal tract. The contrast solution may cause some diarrhea. Depending on your individual set of symptoms, you may also receive an intravenous injection of x-ray contrast/dye. Plan on being at [facility] for 45 minutes or longer, depending on the type of exam you are having performed.   If you have any questions regarding your exam or if you need to reschedule, you may call [facility] at [phone] between the hours of 8:00 am and 5:00 pm, Monday-Friday.   You have been scheduled for a colonoscopy. Please follow written instructions given to you at your visit today.   If you use inhalers (even only as needed), please bring them with you on the day of your procedure.  DO NOT TAKE 7 DAYS PRIOR TO TEST- Trulicity (dulaglutide) Ozempic, Wegovy (semaglutide) Mounjaro (tirzepatide) Bydureon Bcise (exanatide extended release)  DO NOT TAKE 1 DAY PRIOR TO YOUR TEST Rybelsus (semaglutide) Adlyxin (lixisenatide) Victoza (liraglutide) Byetta (exanatide) ___________________________________________________________________________  Janet Porter will receive your bowel preparation through  [facility], which ensures the lowest copay and home delivery, with outreach via text or call from an [phone] number. Please respond promptly to avoid rescheduling of your procedure. If you are interested in alternative options or have any questions regarding your prep, please contact them at [phone] ____________________________________________________________________________  Your Provider Has Sent Your Bowel Prep Regimen To [facility]   [facility] will contact you to verify your information and collect your copay, if applicable. Enjoy the comfort of your home while your prescription is mailed to you, FREE of any shipping charges.   [facility] accepts all major insurance benefits and applies discounts & coupons.  Have additional questions?   Chat: [email or web address] Call: [phone] Email: [email or web address] NCPDP: [id number]  How will [facility] contact you?  With a Welcome phone call,  a Welcome text and a checkout link in text form.  Texts you receive from [phone] Are NOT Spam.  *To set up delivery, you must complete the checkout process via link or speak to one of the patient care representatives. If [facility] is unable to reach you, your prescription may be delayed.  To avoid long hold times on the phone, you may also utilize the secure chat feature on the [facility] website to request that they call you back for transaction completion or to expedite your concerns.  Start taking Miralax 1 capful (17 grams) 1x / day for 1 week.   If this is not effective, increase to 1 dose 2x / day for 1 week.   If this is still not effective, increase to two capfuls (34 grams) 2x / day.   Can adjust dose as needed based on response.  Can take 1/2 cap daily, skip days, or increase per day.    _______________________________________________________  If your blood pressure at your visit was 140/90 or greater, please contact your primary care physician to follow up on  this.  _______________________________________________________  If you are age 63 or older, your body mass index should be between 23-30. Your Body mass index is 30.29 kg/m. If this is out of the aforementioned range listed, please consider follow up with your Primary Care Provider.  If you are age 71 or younger, your body mass index should be between 19-25. Your Body mass index is 30.29 kg/m. If this is out of the aformentioned range listed, please consider follow up with your Primary Care Provider.   ________________________________________________________  The [facility] providers would like to encourage you to use [facility] to communicate with providers for non-urgent requests or questions.  Due to long hold times on the telephone, sending your provider a message by [facility] may be a faster and more efficient way to get a response.  Please allow 48 business hours for a response.  Please remember that this is for non-urgent requests.  _______________________________________________________

## 2023-03-17 NOTE — Progress Notes (Addendum)
 Chief Complaint: Elevated LFTs Primary GI MD: Dr. Orvan Falconer  HPI: 58 year old female with past medical history as listed below presents for evaluation of elevated LFTs  Patient presents for evaluation of elevated  09/23/2022: AST 49/ALT 83/alk phos 83/T. bili 0.8 11/02/2022: AST 40/ALT 78.  Negative hepatitis panel  Upon further review of her past LFTs appear she has had a slightly elevated ALT for at least the last 5 years  RUQ ultrasound 10/20/2022 shows fatty liver, distended gallbladder without stones or sludge.  Hypoechoic lesion measuring 3.2 cm thought to be increased from previously described hemangioma.  Confirmatory contrast exam could be done.  She has had elevated liver enzymes since 2019, with both AST and ALT levels elevated over the past six months. An ultrasound revealed fatty liver and a possible hemangioma. She denies alcohol use and has no known family history of liver problems. Her current medications include Wegovy, Effexor, and simvastatin, with a recent switch from citalopram to Effexor in September. She denies the use of over-the-counter herbs or supplements.  She experiences chronic constipation, having bowel movements approximately once a week, which has worsened since starting [facility] three months ago. Bowel movements are laborious, requiring significant effort, and are associated with bleeding, likely due to hemorrhoids. She has a history of a hyperplastic polyp found during a colonoscopy five years ago. No family history of colon cancer is reported.  Her diet is poor per patient report, consisting mainly of sweets and lacking in vegetables and meats. She lacks appetite for healthy foods and prefers pizza and cheese.   The only change in medication over the last year besides adding Wegovy was changing her anxiety/depression medicine from Lexapro to Effexor.  She changed to Effexor or starting September 2024.     PREVIOUS GI WORKUP   Colonoscopy 01/2018 - One 3 mm  polyp (hyperplastic) in the distal sigmoid colon, removed with a cold snare. Resected and retrieved.  - The examination was otherwise normal on direct and retroflexion views. - repeat 01/2023 due to mother with colon polyps requiring surveillance    Past Medical History:  Diagnosis Date   Abnormal Pap smear 2005   Anxiety state, unspecified    Atypical nevus 11/02/2007   R post upper arm - excision 12/05/2007   Benign neoplasm of skin, site unspecified    Cancer (HCC)    skin cancer    Depressive disorder, not elsewhere classified    Dysplastic nevus 10/27/2006   L med knee - mild   Dysplastic nevus 11/25/2008   back - mild   Esophageal reflux    pt does not take meds   Headache(784.0)    Other specified respiratory tuberculosis, tubercle bacilli not found by bacteriological examination, but tuberculosis confirmed histologically    Pure hypercholesterolemia    Unspecified sinusitis (chronic)     Past Surgical History:  Procedure Laterality Date   ADENOIDECTOMY     COLPOSCOPY  2000   abn pap   DILITATION & CURRETTAGE/HYSTROSCOPY WITH NOVASURE ABLATION N/A 07/13/2012   Procedure: DILATATION & CURETTAGE/HYSTEROSCOPY WITH NOVASURE ABLATION;  Surgeon: Allie Bossier, MD;  Location: [facility];  Service: Gynecology;  Laterality: N/A;   TONSILLECTOMY  1979   TONSILLECTOMY     VARICOSE VEIN SURGERY  2000   VARICOSE VEIN SURGERY      Current Outpatient Medications  Medication Sig Dispense Refill   Semaglutide-Weight Management 0.5 MG/0.5ML SOAJ Inject 0.5 mg into the skin once a week. Inject 0.5 mg into the skin once a  week. 6 mL 3   simvastatin (ZOCOR) 20 MG tablet TAKE 1 TABLET BY MOUTH EVERYDAY AT BEDTIME 90 tablet 2   venlafaxine XR (EFFEXOR-XR) 75 MG 24 hr capsule Take 1 capsule (75 mg total) by mouth daily. 90 capsule 1   No current facility-administered medications for this visit.    Allergies as of 03/17/2023   (No Known Allergies)    Family History  Problem Relation Age  of Onset   Hypertension Father    Diabetes Father    Hyperlipidemia Father    Healthy Mother    Colon polyps Mother    Healthy Brother    Pancreatic cancer Paternal Grandfather    Cancer Maternal Grandfather        Oral   Coronary artery disease Maternal Grandmother        Stents   Colon cancer Neg Hx    Esophageal cancer Neg Hx    Rectal cancer Neg Hx    Stomach cancer Neg Hx     Social History   Socioeconomic History   Marital status: Married    Spouse name: Not on file   Number of children: 3   Years of education: Not on file   Highest education level: Bachelor's degree (e.g., BA, AB, BS)  Occupational History   Occupation: Homemaker  Tobacco Use   Smoking status: Never   Smokeless tobacco: Never  Vaping Use   Vaping status: Never Used  Substance and Sexual Activity   Alcohol use: No   Drug use: No   Sexual activity: Yes    Partners: Male    Birth control/protection: Condom  Other Topics Concern   Not on file  Social History Narrative   Walks daily      Married x 13 years, no abuse      3 kids; ages 3-10      Homemaker      3 meals, limited fruits and veggies; sweet iced tea   Social Drivers of Corporate investment banker Strain: Low Risk  (11/08/2022)   Overall Financial Resource Strain (CARDIA)    Difficulty of Paying Living Expenses: Not hard at all  Food Insecurity: No Food Insecurity (11/08/2022)   Hunger Vital Sign    Worried About Running Out of Food in the Last Year: Never true    Ran Out of Food in the Last Year: Never true  Transportation Needs: No Transportation Needs (11/08/2022)   PRAPARE - Administrator, Civil Service (Medical): No    Lack of Transportation (Non-Medical): No  Physical Activity: Sufficiently Active (11/08/2022)   Exercise Vital Sign    Days of Exercise per Week: 4 days    Minutes of Exercise per Session: 40 min  Stress: No Stress Concern Present (11/08/2022)   [employer] of Occupational Health -  Occupational Stress Questionnaire    Feeling of Stress : Only a little  Social Connections: Moderately Integrated (11/08/2022)   Social Connection and Isolation Panel [NHANES]    Frequency of Communication with Friends and Family: More than three times a week    Frequency of Social Gatherings with Friends and Family: Twice a week    Attends Religious Services: More than 4 times per year    Active Member of [employer] or Organizations: No    Attends Engineer, structural: Not on file    Marital Status: Married  Catering manager Violence: Not on file    Review of Systems:    Constitutional: No weight loss,  fever, chills, weakness or fatigue HEENT: Eyes: No change in vision               Ears, Nose, Throat:  No change in hearing or congestion Skin: No rash or itching Cardiovascular: No chest pain, chest pressure or palpitations   Respiratory: No SOB or cough Gastrointestinal: See HPI and otherwise negative Genitourinary: No dysuria or change in urinary frequency Neurological: No headache, dizziness or syncope Musculoskeletal: No new muscle or joint pain Hematologic: No bleeding or bruising Psychiatric: No history of depression or anxiety    Physical Exam:  Vital signs: BP 108/76   Pulse 95   Ht 5\' 3"  (1.6 m)   Wt 171 lb (77.6 kg)   BMI 30.29 kg/m   Constitutional: NAD, Well developed, Well nourished, alert and cooperative Head:  Normocephalic and atraumatic. Eyes:   PEERL, EOMI. No icterus. Conjunctiva pink. Respiratory: Respirations even and unlabored. Lungs clear to auscultation bilaterally.   No wheezes, crackles, or rhonchi.  Cardiovascular:  Regular rate and rhythm. No peripheral edema, cyanosis or pallor.  Gastrointestinal:  Soft, nondistended, nontender. No rebound or guarding. Normal bowel sounds. No appreciable masses or hepatomegaly. Rectal:  Not performed.  Msk:  Symmetrical without gross deformities. Without edema, no deformity or joint abnormality.   Neurologic:  Alert and  oriented x4;  grossly normal neurologically.  Skin:   Dry and intact without significant lesions or rashes. Psychiatric: Oriented to person, place and time. Demonstrates good judgement and reason without abnormal affect or behaviors.   RELEVANT LABS AND IMAGING: CBC    Component Value Date/Time   WBC 4.2 03/04/2020 0953   WBC 9.4 11/11/2018 1907   RBC 4.79 03/04/2020 0953   RBC 4.61 11/11/2018 1907   HGB 14.6 03/04/2020 0953   HCT 43.8 03/04/2020 0953   PLT 223 03/04/2020 0953   MCV 91 03/04/2020 0953   MCH 30.5 03/04/2020 0953   MCH 31.7 11/11/2018 1907   MCHC 33.3 03/04/2020 0953   MCHC 34.8 11/11/2018 1907   RDW 12.7 03/04/2020 0953    CMP     Component Value Date/Time   NA 141 09/21/2022 0751   NA 141 03/04/2020 0953   K 4.1 09/21/2022 0751   CL 106 09/21/2022 0751   CO2 28 09/21/2022 0751   GLUCOSE 117 (H) 09/21/2022 0751   BUN 15 09/21/2022 0751   BUN 14 03/04/2020 0953   CREATININE 0.76 09/21/2022 0751   CREATININE 0.78 11/10/2015 0907   CALCIUM 9.0 09/21/2022 0751   PROT 7.0 11/02/2022 0741   PROT 6.6 03/04/2020 0953   ALBUMIN 4.6 11/02/2022 0741   ALBUMIN 4.7 03/04/2020 0953   AST 48 (H) 11/02/2022 0741   ALT 78 (H) 11/02/2022 0741   ALKPHOS 94 11/02/2022 0741   BILITOT 1.1 11/02/2022 0741   BILITOT 0.5 03/04/2020 0953   GFRNONAA 80 03/04/2020 0953   GFRAA 92 03/04/2020 0953     Assessment/Plan:      Elevated Liver Enzymes Fatty liver AST and ALT elevated for the past six months with slightly elevated ALT ongoing for the last 5 years. Ultrasound showed fatty liver and possible hemangioma. No alcohol use. Poor diet and overweight, which can contribute to fatty liver.  Suspected metabolic dysfunction associated seatohepatitis (MASH, formerly NASH)  by history and ultrasound.  Must exclude other chronic causes of hepatocellular inflammation that can mimic fatty liver on ultrasound. Switch to Effexor for September 2024 and  started [facility] 3 months ago.  Effexor possibly could be causing  her elevation in LFTs versus fatty liver versus other.  Suspect her weight loss with Reginal Lutes will aid in her LFTs. --Order CT scan abdomen pelvis with contrast to further evaluate liver and confirm hemangioma. --Full serologic workup for elevated LFTs including CBC, CMP, PT/INR, iron, ferritin, alpha-1 antitrypsin deficiency, ceruloplasmin, ASMA, AMA, ANA, IgG -Discussed fatty liver in regards to diet and exercise and provided patient education handouts -- consider ELF versus fibrosure -- if stage 2-3 fibrosis can consider Rezdiffra -- need LFTs and CBC monitored every 6 months, revaluation every 2-3 years.  -- Continue to work on risk factor modification including diet exercise and control of risk factors including blood sugars.  Constipation Chronic constipation, worsened by RKYHCW. associated with abdominal pain and straining during bowel movements. History of hemorrhoids and rectal bleeding. --Start Miralax, one capful daily, adjusting as needed based on bowel movement regularity and consistency. --Consider Ibgard for abdominal pain associated with constipation. -- TSH  Family history of colon polyps History of hyperplastic polyp in 2020. Due for colonoscopy based on previous provider's recommendation. --Schedule colonoscopy with a female provider per patient's preference. - I thoroughly discussed the procedure with the patient (at bedside) to include nature of the procedure, alternatives, benefits, and risks (including but not limited to bleeding, infection, perforation, anesthesia/cardiac pulmonary complications).  Patient verbalized understanding and gave verbal consent to proceed with procedure.   Gallbladder Ultrasound showed distended gallbladder, but no stones or sludge.  Could also be contributing to her LFTs although her elevated LFTs are more in a hepatocellular pattern versus obstructive pattern. --Evaluate  gallbladder during CT scan.      Boone Master, PA-C [facility] 03/17/2023, 9:26 AM  Cc: Excell Seltzer, MD  I have reviewed the clinic note as outlined by Boone Master, PA and agree with the assessment, plan and medical decision making.  Janet Porter presents to the office for evaluation of elevated liver enzymes, constipation, family history of colon polyps and distended gallbladder on ultrasound imaging.  I agree with performing workup for chronic hepatitides and will follow-up serologies.  Can use MiraLAX for chronic constipation.  Will need to further define family history to determine if mother's colon polyp was truly an advanced polyp.  In the interim can proceed with colonoscopy.  Will follow-up liver morphology and gallbladder on CT scan.  Maren Beach, MD

## 2023-03-22 LAB — ANTI-NUCLEAR AB-TITER (ANA TITER): ANA Titer 1: 1:40 {titer} — ABNORMAL HIGH

## 2023-03-22 LAB — MITOCHONDRIAL ANTIBODIES: Mitochondrial M2 Ab, IgG: <=20 U (ref <=20.0)

## 2023-03-22 LAB — ANTI-SMOOTH MUSCLE ANTIBODY, IGG: Actin (Smooth Muscle) Antibody (IGG): <20 U (ref <20)

## 2023-03-22 LAB — ANA: Anti Nuclear Antibody (ANA): POSITIVE — AB

## 2023-03-22 LAB — ANTI-DNA ANTIBODY, DOUBLE-STRANDED: ds DNA Ab: <1 [IU]/mL

## 2023-03-22 LAB — COPPER, SERUM: Copper: 114 ug/dL (ref 70–175)

## 2023-03-22 LAB — CERULOPLASMIN: Ceruloplasmin: 27 mg/dL (ref 14–48)

## 2023-03-22 LAB — IGG: IgG (Immunoglobin G), Serum: 927 mg/dL (ref 600–1640)

## 2023-03-22 LAB — ALPHA-1-ANTITRYPSIN: A-1 Antitrypsin, Ser: 128 mg/dL (ref 83–199)

## 2023-03-23 ENCOUNTER — Encounter: Payer: Self-pay | Admitting: Family Medicine

## 2023-03-23 ENCOUNTER — Encounter: Payer: Self-pay | Admitting: Pediatrics

## 2023-03-24 ENCOUNTER — Ambulatory Visit (HOSPITAL_COMMUNITY)
Admission: RE | Admit: 2023-03-24 | Discharge: 2023-03-24 | Disposition: A | Discharge status: Home / Self Care | Payer: No Typology Code available for payment source | Source: Ambulatory Visit | Attending: Gastroenterology | Admitting: Gastroenterology

## 2023-03-24 DIAGNOSIS — R935 Abnormal findings on diagnostic imaging of other abdominal regions, including retroperitoneum: Secondary | ICD-10-CM | POA: Diagnosis present

## 2023-03-24 DIAGNOSIS — K760 Fatty (change of) liver, not elsewhere classified: Secondary | ICD-10-CM | POA: Insufficient documentation

## 2023-03-24 DIAGNOSIS — Z83719 Family history of colon polyps, unspecified: Secondary | ICD-10-CM | POA: Diagnosis present

## 2023-03-24 DIAGNOSIS — R7989 Other specified abnormal findings of blood chemistry: Secondary | ICD-10-CM | POA: Insufficient documentation

## 2023-03-24 DIAGNOSIS — K5909 Other constipation: Secondary | ICD-10-CM | POA: Insufficient documentation

## 2023-03-24 MED ORDER — IOHEXOL 300 MG/ML  SOLN
100.0000 mL | Freq: Once | INTRAMUSCULAR | Status: AC | PRN
  Administered 2023-03-24: 100 mL via INTRAVENOUS

## 2023-03-30 ENCOUNTER — Telehealth: Payer: Self-pay | Admitting: Gastroenterology

## 2023-03-30 NOTE — Telephone Encounter (Signed)
Patient called to go over CT results.

## 2023-03-30 NOTE — Telephone Encounter (Signed)
 Patient is advised that we have not yet received CT results. Advised that we have been seeing a 2 to 2.5 week turn around time for radiology readings on outpatient imaging but explained that we will reach out with results as they are made available to our office. Patient verbalizes understanding.

## 2023-03-30 NOTE — Progress Notes (Unsigned)
 [facility] History and Physical   Primary Care Physician:  Excell Seltzer, MD   Reason for Procedure:  Colon cancer screening  Plan:    Screening colonoscopy   HPI: Janet Porter is a 58 y.o. female undergoing screening colonoscopy.  Patient underwent colonoscopy in 2020 which revealed 1 hyperplastic polyp.  There is a family history of polyps in the patient's mother.  Dr. Orvan Falconer recommended a follow-up colonoscopy in 5 years.  Patient also has longstanding history of constipation recently exacerbated by the use of Wegovy.   Past Medical History:  Diagnosis Date   Abnormal Pap smear 2005   Anxiety state, unspecified    Atypical nevus 11/02/2007   R post upper arm - excision 12/05/2007   Benign neoplasm of skin, site unspecified    Cancer (HCC)    skin cancer    Depressive disorder, not elsewhere classified    Dysplastic nevus 10/27/2006   L med knee - mild   Dysplastic nevus 11/25/2008   back - mild   Esophageal reflux    pt does not take meds   Headache(784.0)    Other specified respiratory tuberculosis, tubercle bacilli not found by bacteriological examination, but tuberculosis confirmed histologically    Pure hypercholesterolemia    Unspecified sinusitis (chronic)     Past Surgical History:  Procedure Laterality Date   ADENOIDECTOMY     COLPOSCOPY  2000   abn pap   DILITATION & CURRETTAGE/HYSTROSCOPY WITH NOVASURE ABLATION N/A 07/13/2012   Procedure: DILATATION & CURETTAGE/HYSTEROSCOPY WITH NOVASURE ABLATION;  Surgeon: Allie Bossier, MD;  Location: [facility];  Service: Gynecology;  Laterality: N/A;   TONSILLECTOMY  1979   TONSILLECTOMY     VARICOSE VEIN SURGERY  2000   VARICOSE VEIN SURGERY      Prior to Admission medications   Medication Sig Start Date End Date Taking? Authorizing Provider  Semaglutide-Weight Management 0.5 MG/0.5ML SOAJ Inject 0.5 mg into the skin once a week. Inject 0.5 mg into the skin once a week. 01/27/23   [name], [name], MD   simvastatin (ZOCOR) 20 MG tablet TAKE 1 TABLET BY MOUTH EVERYDAY AT BEDTIME 01/29/23   [name], [name], MD  venlafaxine XR (EFFEXOR-XR) 75 MG 24 hr capsule Take 1 capsule (75 mg total) by mouth daily. 12/29/22   Excell Seltzer, MD    Current Outpatient Medications  Medication Sig Dispense Refill   simvastatin (ZOCOR) 20 MG tablet TAKE 1 TABLET BY MOUTH EVERYDAY AT BEDTIME 90 tablet 2   venlafaxine XR (EFFEXOR-XR) 75 MG 24 hr capsule Take 1 capsule (75 mg total) by mouth daily. 90 capsule 1   Semaglutide-Weight Management 0.5 MG/0.5ML SOAJ Inject 0.5 mg into the skin once a week. Inject 0.5 mg into the skin once a week. 6 mL 3   Current Facility-Administered Medications  Medication Dose Route Frequency Provider Last Rate Last Admin   0.9 %  sodium chloride infusion  500 mL Intravenous Once Saige Busby, Durene Romans, MD        Allergies as of 03/31/2023   (No Known Allergies)    Family History  Problem Relation Age of Onset   Hypertension Father    Diabetes Father    Hyperlipidemia Father    Healthy Mother    Colon polyps Mother    Healthy Brother    Pancreatic cancer Paternal Grandfather    Cancer Maternal Grandfather        Oral   Coronary artery disease Maternal Grandmother  Stents   Colon cancer Neg Hx    Esophageal cancer Neg Hx    Rectal cancer Neg Hx    Stomach cancer Neg Hx     Social History   Socioeconomic History   Marital status: Married    Spouse name: Not on file   Number of children: 3   Years of education: Not on file   Highest education level: Bachelor's degree (e.g., BA, AB, BS)  Occupational History   Occupation: Homemaker  Tobacco Use   Smoking status: Never   Smokeless tobacco: Never  Vaping Use   Vaping status: Never Used  Substance and Sexual Activity   Alcohol use: No   Drug use: No   Sexual activity: Yes    Partners: Male    Birth control/protection: Condom  Other Topics Concern   Not on file  Social History Narrative   Walks daily       Married x 13 years, no abuse      3 kids; ages 3-10      Homemaker      3 meals, limited fruits and veggies; sweet iced tea   Social Drivers of Corporate investment banker Strain: Low Risk  (11/08/2022)   Overall Financial Resource Strain (CARDIA)    Difficulty of Paying Living Expenses: Not hard at all  Food Insecurity: No Food Insecurity (11/08/2022)   Hunger Vital Sign    Worried About Running Out of Food in the Last Year: Never true    Ran Out of Food in the Last Year: Never true  Transportation Needs: No Transportation Needs (11/08/2022)   PRAPARE - Administrator, Civil Service (Medical): No    Lack of Transportation (Non-Medical): No  Physical Activity: Sufficiently Active (11/08/2022)   Exercise Vital Sign    Days of Exercise per Week: 4 days    Minutes of Exercise per Session: 40 min  Stress: No Stress Concern Present (11/08/2022)   [employer] of Occupational Health - Occupational Stress Questionnaire    Feeling of Stress : Only a little  Social Connections: Moderately Integrated (11/08/2022)   Social Connection and Isolation Panel [NHANES]    Frequency of Communication with Friends and Family: More than three times a week    Frequency of Social Gatherings with Friends and Family: Twice a week    Attends Religious Services: More than 4 times per year    Active Member of [employer] or Organizations: No    Attends Engineer, structural: Not on file    Marital Status: Married  Catering manager Violence: Not on file    Review of Systems:  All other review of systems negative except as mentioned in the HPI.  Physical Exam: Vital signs BP (!) 188/120   Pulse 81   Temp 98.1 F (36.7 C)   Resp 14   Ht 5\' 3"  (1.6 m)   Wt 171 lb (77.6 kg)   SpO2 100%   BMI 30.29 kg/m   General:   Alert,  Well-developed, well-nourished, pleasant and cooperative in NAD Airway:  Mallampati 2 Lungs:  Clear throughout to auscultation.   Heart:  Regular rate  and rhythm; no murmurs, clicks, rubs,  or gallops. Abdomen:  Soft, nontender and nondistended. Normal bowel sounds.   Neuro/Psych:  Normal mood and affect. A and O x 3  Maren Beach, MD [facility]

## 2023-03-31 ENCOUNTER — Encounter: Payer: Self-pay | Admitting: Pediatrics

## 2023-03-31 ENCOUNTER — Ambulatory Visit: Payer: No Typology Code available for payment source | Admitting: Pediatrics

## 2023-03-31 VITALS — BP 142/88 | HR 69 | Temp 98.1°F | Resp 10 | Ht 63.0 in | Wt 171.0 lb

## 2023-03-31 DIAGNOSIS — Z1211 Encounter for screening for malignant neoplasm of colon: Secondary | ICD-10-CM

## 2023-03-31 DIAGNOSIS — D127 Benign neoplasm of rectosigmoid junction: Secondary | ICD-10-CM | POA: Diagnosis not present

## 2023-03-31 DIAGNOSIS — Z83719 Family history of colon polyps, unspecified: Secondary | ICD-10-CM

## 2023-03-31 DIAGNOSIS — K5909 Other constipation: Secondary | ICD-10-CM | POA: Diagnosis not present

## 2023-03-31 DIAGNOSIS — Z860102 Personal history of hyperplastic colon polyps: Secondary | ICD-10-CM | POA: Diagnosis not present

## 2023-03-31 DIAGNOSIS — R7989 Other specified abnormal findings of blood chemistry: Secondary | ICD-10-CM

## 2023-03-31 DIAGNOSIS — K648 Other hemorrhoids: Secondary | ICD-10-CM | POA: Diagnosis not present

## 2023-03-31 DIAGNOSIS — K621 Rectal polyp: Secondary | ICD-10-CM | POA: Diagnosis not present

## 2023-03-31 DIAGNOSIS — D128 Benign neoplasm of rectum: Secondary | ICD-10-CM

## 2023-03-31 MED ORDER — SODIUM CHLORIDE 0.9 % IV SOLN: 500.0000 mL | Freq: Once | INTRAVENOUS | Status: DC

## 2023-03-31 NOTE — Progress Notes (Signed)
 Vss nad trans to pacu

## 2023-03-31 NOTE — Op Note (Signed)
 [facility] Patient Name: Janet Porter Procedure Date: 03/31/2023 10:01 AM MRN: [id number] Endoscopist: Maren Beach , MD, [id number] Age: 58 Referring MD:  Date of Birth: [birth year] Gender: Female Account #: [id number] Procedure:                Colonoscopy Indications:              Colon cancer screening in patient at increased                            risk: Family history of 1st-degree relative with                            colon polyps, Last colonoscopy: January 2020 Medicines:                Monitored Anesthesia Care Procedure:                Pre-Anesthesia Assessment:                           - Prior to the procedure, a History and Physical                            was performed, and patient medications and                            allergies were reviewed. The patient's tolerance of                            previous anesthesia was also reviewed. The risks                            and benefits of the procedure and the sedation                            options and risks were discussed with the patient.                            All questions were answered, and informed consent                            was obtained. Prior Anticoagulants: The patient has                            taken no anticoagulant or antiplatelet agents. ASA                            Grade Assessment: II - A patient with mild systemic                            disease. After reviewing the risks and benefits,                            the patient was deemed in satisfactory condition to  undergo the procedure.                           After obtaining informed consent, the colonoscope                            was passed under direct vision. Throughout the                            procedure, the patient's blood pressure, pulse, and                            oxygen saturations were monitored continuously. The                            CF [id number]  #[id number] was introduced through the anus                            and advanced to the cecum, identified by                            appendiceal orifice and ileocecal valve. The                            colonoscopy was performed without difficulty. The                            patient tolerated the procedure well. Scope In: 10:11:51 AM Scope Out: 10:32:31 AM Scope Withdrawal Time: 0 hours 12 minutes 16 seconds  Total Procedure Duration: 0 hours 20 minutes 40 seconds  Findings:                 Hemorrhoids were found on perianal exam.                           The digital rectal exam was normal. Pertinent                            negatives include normal sphincter tone and no                            palpable rectal lesions.                           Scattered diminutive polyps were seen in the                            rectosigmoid colon. A 4 mm polyp was found in the                            rectum and sampled for pathology. The polyp was                            sessile. The polyp was removed with a cold biopsy  forceps. Resection and retrieval were complete.                           A moderate amount of semi-solid stool was found in                            the recto-sigmoid colon, in the descending colon,                            in the ascending colon and in the cecum,                            interfering with visualization. Lavage of the area                            was performed using a large amount of sterile                            water, resulting in clearance with adequate                            visualization throughout most of the colon.                            Portions of the descending and rectosigmoid colon                            were obscured by semisolid stool.                           Internal hemorrhoids were found during retroflexion. Complications:            - Patient had retching with some emesis  during                            procedure. Estimated Blood Loss:     Estimated blood loss was minimal. Impression:               - Hemorrhoids found on perianal exam.                           - One 4 mm polyp in the rectum, removed with a cold                            biopsy forceps. Resected and retrieved.                           - Stool in the recto-sigmoid colon, in the                            descending colon, in the ascending colon and in the                            cecum.                           -  Internal hemorrhoids. Recommendation:           - Discharge patient to home (ambulatory).                           - Await pathology results.                           - Repeat colonoscopy in 5 years for surveillance.                            Bowel prep was adequate throughout most of the                            colon, however, there were portions of the                            descending colon and rectosigmoid colon that                            contained semisolid stool that could not be lavaged                            away. Noted that patient has constipation at                            baseline now exacerbated on Wegovy. Consider 2-day                            bowel prep prior to next colonoscopy and or                            additional laxative the week before colonoscopy.                            Patient reported nausea with bowel prep and can be                            given Zofran in the future to accompany bowel                            preparation.                           - The findings and recommendations were discussed                            with the patient's family.                           - Return to referring physician.                           - Patient has a contact number available for  emergencies. The signs and symptoms of potential                            delayed complications were  discussed with the                            patient. Return to normal activities tomorrow.                            Written discharge instructions were provided to the                            patient. Maren Beach, MD 03/31/2023 10:40:51 AM This report has been signed electronically.

## 2023-03-31 NOTE — Progress Notes (Signed)
 Called to room to assist during endoscopic procedure.  Patient ID and intended procedure confirmed with present staff. Received instructions for my participation in the procedure from the performing physician.

## 2023-03-31 NOTE — Patient Instructions (Addendum)
 Resume previous diet Continue present medications Await pathology results 5 year colonoscopy with 2 day prep for next colonoscopy Handouts/information given for constipation, polyps and hemorrhoids  YOU HAD AN ENDOSCOPIC PROCEDURE TODAY AT THE [facility]:   Refer to the procedure report that was given to you for any specific questions about what was found during the examination.  If the procedure report does not answer your questions, please call your gastroenterologist to clarify.  If you requested that your care partner not be given the details of your procedure findings, then the procedure report has been included in a sealed envelope for you to review at your convenience later.  YOU SHOULD EXPECT: Some feelings of bloating in the abdomen. Passage of more gas than usual.  Walking can help get rid of the air that was put into your GI tract during the procedure and reduce the bloating. If you had a lower endoscopy (such as a colonoscopy or flexible sigmoidoscopy) you may notice spotting of blood in your stool or on the toilet paper. If you underwent a bowel prep for your procedure, you may not have a normal bowel movement for a few days.  Please Note:  You might notice some irritation and congestion in your nose or some drainage.  This is from the oxygen used during your procedure.  There is no need for concern and it should clear up in a day or so.  SYMPTOMS TO REPORT IMMEDIATELY:  Following lower endoscopy (colonoscopy):  Excessive amounts of blood in the stool  Significant tenderness or worsening of abdominal pains  Swelling of the abdomen that is new, acute  Fever of 100F or higher For urgent or emergent issues, a gastroenterologist can be reached at any hour by calling [phone]. Do not use MyChart messaging for urgent concerns.   DIET:  We do recommend a small meal at first, but then you may proceed to your regular diet.  Drink plenty of fluids but you should avoid  alcoholic beverages for 24 hours.  ACTIVITY:  You should plan to take it easy for the rest of today and you should NOT DRIVE or use heavy machinery until tomorrow (because of the sedation medicines used during the test).    FOLLOW UP: Our staff will call the number listed on your records the next business day following your procedure.  We will call around 7:15- 8:00 am to check on you and address any questions or concerns that you may have regarding the information given to you following your procedure. If we do not reach you, we will leave a message.     If any biopsies were taken you will be contacted by phone or by letter within the next 1-3 weeks.  Please call us at [phone] if you have not heard about the biopsies in 3 weeks.   SIGNATURES/CONFIDENTIALITY: You and/or your care partner have signed paperwork which will be entered into your electronic medical record.  These signatures attest to the fact that that the information above on your After Visit Summary has been reviewed and is understood.  Full responsibility of the confidentiality of this discharge information lies with you and/or your care-partner.

## 2023-04-03 ENCOUNTER — Telehealth: Payer: Self-pay

## 2023-04-03 NOTE — Telephone Encounter (Signed)
 No answer, left message to call if having any issues or concerns, B.Vale Mousseau RN

## 2023-04-04 LAB — SURGICAL PATHOLOGY

## 2023-04-20 ENCOUNTER — Encounter: Payer: Self-pay | Admitting: Pediatrics

## 2023-04-24 ENCOUNTER — Other Ambulatory Visit: Payer: Self-pay | Admitting: Family Medicine

## 2023-04-24 NOTE — Telephone Encounter (Signed)
 Please submit PA for [facility].

## 2023-04-27 ENCOUNTER — Other Ambulatory Visit (HOSPITAL_COMMUNITY): Payer: Self-pay

## 2023-04-27 ENCOUNTER — Telehealth: Payer: Self-pay

## 2023-04-27 ENCOUNTER — Encounter: Payer: Self-pay | Admitting: *Deleted

## 2023-04-27 NOTE — Telephone Encounter (Signed)
 Per test claim, pt must enroll in [employer]. Pt can visit: [email or web address] to enroll. No PA submitted at this time.

## 2023-04-27 NOTE — Telephone Encounter (Signed)
 Pharmacy Patient Advocate Encounter   Received notification from RX Request Messages that prior authorization for [facility] is required/requested.   Insurance verification completed.   The patient is insured through [employer] .   Per test claim:  Pt must enroll in [employer]. Pt can visit: [email or web address] to enroll. No PA submitted at this time.  This has been communicated with clinic in original request

## 2023-05-24 ENCOUNTER — Other Ambulatory Visit: Payer: Self-pay | Admitting: Family Medicine

## 2023-05-24 NOTE — Telephone Encounter (Signed)
 Please submit PA.

## 2023-06-01 NOTE — Telephone Encounter (Signed)
 PA request has been resolved and documented in separate encounter, please sign off on rx in this encounter as PA team is unable to resolve RX requests. Thank you

## 2023-06-01 NOTE — Telephone Encounter (Signed)
 Per test claim pt must enroll in [employer], no PA will be submitted at this time until below criteria are met. Please sign off on rx request as PA team is unable to. Thank you.

## 2023-06-01 NOTE — Telephone Encounter (Signed)
 Please submit PA for [facility].

## 2023-06-28 ENCOUNTER — Other Ambulatory Visit: Payer: Self-pay | Admitting: Family Medicine

## 2023-06-28 NOTE — Telephone Encounter (Signed)
 Alternative Requested:PER [employer] Medication NOT COVERED?

## 2023-06-29 NOTE — Telephone Encounter (Signed)
 Contact patient, Wegovy  is not covered does she want to get it through self-pay drug company.

## 2023-07-26 ENCOUNTER — Telehealth: Payer: Self-pay | Admitting: *Deleted

## 2023-07-26 NOTE — Telephone Encounter (Signed)
 Spoke with Janet Porter.  She has been sick for about 2 weeks. She has not taken a Covid test but still has cough, congestion and loss of taste/smell.  I recommended that she keep her appointment as scheduled since her illness has lasted over 2 weeks she may need antibiotics and she will need to be seen before anything can be prescribed.  Patient states understanding and will keep appointment on 07/27/2023 at 2:00 pm.

## 2023-07-26 NOTE — Telephone Encounter (Signed)
 Copied from CRM [id number]. Topic: Appointments - Appointment Scheduling >> Jul 26, 2023 11:46 AM Franky GRADE wrote: Patient/patient representative is calling to schedule an appointment. Refer to attachments for appointment information. Patient is experiencing Loss of taste and smell, nasal congestion, cough, scheduled an appointment for tomorrow at 2pm. She would like to know if it's necessary to come in regarding the symptoms.

## 2023-07-27 ENCOUNTER — Encounter: Payer: Self-pay | Admitting: Family Medicine

## 2023-07-27 ENCOUNTER — Ambulatory Visit (INDEPENDENT_AMBULATORY_CARE_PROVIDER_SITE_OTHER): Admitting: Family Medicine

## 2023-07-27 VITALS — BP 110/80 | HR 87 | Temp 97.6°F | Ht 63.0 in | Wt 168.4 lb

## 2023-07-27 DIAGNOSIS — J069 Acute upper respiratory infection, unspecified: Secondary | ICD-10-CM | POA: Insufficient documentation

## 2023-07-27 DIAGNOSIS — R430 Anosmia: Secondary | ICD-10-CM | POA: Insufficient documentation

## 2023-07-27 NOTE — Assessment & Plan Note (Signed)
 Acute, likely secondary to recent viral infection.  No current sign of bacterial superinfection. Recommended nasal saline spray or irrigation as well as time for symptoms to resolve.  Return and ER precautions provided.

## 2023-07-27 NOTE — Progress Notes (Signed)
 Patient ID: Janet Porter, female    DOB: August 23, 1965, 58 y.o.   MRN: [id number]  This visit was conducted in person.  BP 110/80   Pulse 87   Temp 97.6 F (36.4 C) (Temporal)   Ht 5' 3 (1.6 m)   Wt 168 lb 6 oz (76.4 kg)   SpO2 94%   BMI 29.83 kg/m    CC:  Chief Complaint  Patient presents with   Cough    X 2 weeks   Loss of Taste/Smell   Nasal Congestion   Fatigue         Subjective:   HPI: Janet Porter is a 58 y.o. female presenting on 07/27/2023 for Cough (X 2 weeks), Loss of Taste/Smell, Nasal Congestion, and Fatigue (/)   Date of onset:  2 weeks  Initial symptoms included  runny nose, body aches, headache Symptoms progressed to fatigue, nasal congestion, low of taste and swell.  She feels better overall.  No fever.  No SOB. No ear pain  No face pain.  Currently back to normal except taste and smell deficit, still alsop tired., occ mild dry cough.    Sick contacts:  none COVID testing:   none     She has tried to treat with  Nyquil.  No additional over-the-counter medication such as nasal sprays.     No history of chronic lung disease such as asthma or COPD. Non-smoker.  Restarted GLP1 month 2  semaglutide .... Not listed as side effect. Wt Readings from Last 3 Encounters:  07/27/23 168 lb 6 oz (76.4 kg)  03/31/23 171 lb (77.6 kg)  03/17/23 171 lb (77.6 kg)        Relevant past medical, surgical, family and social history reviewed and updated as indicated. Interim medical history since our last visit reviewed. Allergies and medications reviewed and updated. Outpatient Medications Prior to Visit  Medication Sig Dispense Refill   Semaglutide -Weight Management 0.5 MG/0.5ML SOAJ Inject 0.5 mg into the skin once a week. Inject 0.5 mg into the skin once a week. 6 mL 3   simvastatin  (ZOCOR ) 20 MG tablet TAKE 1 TABLET BY MOUTH EVERYDAY AT BEDTIME 90 tablet 2   venlafaxine  XR (EFFEXOR -XR) 75 MG 24 hr capsule TAKE 1 CAPSULE BY MOUTH EVERY DAY 90  capsule 0   No facility-administered medications prior to visit.     Per HPI unless specifically indicated in ROS section below Review of Systems  Constitutional:  Negative for fatigue and fever.  HENT:  Positive for rhinorrhea. Negative for congestion.   Eyes:  Negative for pain.  Respiratory:  Positive for cough. Negative for shortness of breath.   Cardiovascular:  Negative for chest pain, palpitations and leg swelling.  Gastrointestinal:  Negative for abdominal pain.  Genitourinary:  Negative for dysuria and vaginal bleeding.  Musculoskeletal:  Negative for back pain.  Neurological:  Negative for syncope, light-headedness and headaches.  Psychiatric/Behavioral:  Negative for dysphoric mood.    Objective:  BP 110/80   Pulse 87   Temp 97.6 F (36.4 C) (Temporal)   Ht 5' 3 (1.6 m)   Wt 168 lb 6 oz (76.4 kg)   SpO2 94%   BMI 29.83 kg/m   Wt Readings from Last 3 Encounters:  07/27/23 168 lb 6 oz (76.4 kg)  03/31/23 171 lb (77.6 kg)  03/17/23 171 lb (77.6 kg)      Physical Exam Constitutional:      General: She is not in acute distress.  Appearance: She is well-developed. She is not ill-appearing or toxic-appearing.  HENT:     Head: Normocephalic.     Right Ear: Hearing, tympanic membrane, ear canal and external ear normal. Tympanic membrane is not erythematous, retracted or bulging.     Left Ear: Hearing, tympanic membrane, ear canal and external ear normal. Tympanic membrane is not erythematous, retracted or bulging.     Nose: Mucosal edema present. No rhinorrhea.     Right Sinus: No maxillary sinus tenderness or frontal sinus tenderness.     Left Sinus: No maxillary sinus tenderness or frontal sinus tenderness.     Mouth/Throat:     Pharynx: Uvula midline.  Eyes:     General: Lids are normal. Lids are everted, no foreign bodies appreciated.     Conjunctiva/sclera: Conjunctivae normal.     Pupils: Pupils are equal, round, and reactive to light.  Neck:      Thyroid : No thyroid  mass or thyromegaly.     Vascular: No carotid bruit.     Trachea: Trachea normal.  Cardiovascular:     Rate and Rhythm: Normal rate and regular rhythm.     Pulses: Normal pulses.     Heart sounds: Normal heart sounds, S1 normal and S2 normal. No murmur heard.    No friction rub. No gallop.  Pulmonary:     Effort: Pulmonary effort is normal. No tachypnea or respiratory distress.     Breath sounds: Normal breath sounds. No decreased breath sounds, wheezing, rhonchi or rales.  Musculoskeletal:     Cervical back: Normal range of motion and neck supple.  Skin:    General: Skin is warm and dry.     Findings: No rash.  Neurological:     Mental Status: She is alert.  Psychiatric:        Mood and Affect: Mood is not anxious or depressed.        Speech: Speech normal.        Behavior: Behavior normal. Behavior is cooperative.        Judgment: Judgment normal.       Results for orders placed or performed in visit on 03/31/23  Surgical pathology ([facility])   Collection Time: 03/31/23 12:00 AM  Result Value Ref Range   SURGICAL PATHOLOGY      SURGICAL PATHOLOGY [facility] [address] [place], [place] [address] Telephone [phone] or [phone] Fax [phone]  REPORT OF SURGICAL PATHOLOGY   Accession #: [id number] Patient Name: ALIZEY, NOREN Visit # : [id number]  MRN: [id number] Physician: Shila Gustav [place] DOB/Age [birth year] (Age: 47) Gender: F Collected Date: 03/31/2023 Received Date: 04/03/2023  FINAL DIAGNOSIS       1. Surgical [P], colon, rectum, polyp (1) :       HYPERPLASTIC POLYP.      NEGATIVE FOR DYSPLASIA.       DATE SIGNED OUT: 04/04/2023 ELECTRONIC SIGNATURE : Pepper Dutton Md, Pathologist, Electronic Signature  MICROSCOPIC DESCRIPTION  CASE COMMENTS STAINS USED IN DIAGNOSIS: H&E    CLINICAL HISTORY  SPECIMEN(S) OBTAINED 1. Surgical [P], Colon, Rectum, Polyp (1)  SPECIMEN  COMMENTS: 1. Elevated liver function tests; chronic constipation; benign neoplasm of rectum and anal canal SPECIMEN CLINICAL INFORMATION: 1. R/O adenoma     Gross Description 1. Received in formalin are tan, soft tissue fragments that are submitted in toto. Number: 1 Size: 0.3 cm, (1B) ( TA )        Report signed out from the following location(s)  [place] [address] [place], [place] [address] CLIA #: [id number]  [facility] [address] [place], [place] [address] CLIA #: [id number]     Assessment and Plan  Viral URI Assessment & Plan: Acute, resolving Symptomatic care, rest and fluids.  Return and ER precautions provided.   Anosmia Assessment & Plan: Acute, likely secondary to recent viral infection.  No current sign of bacterial superinfection. Recommended nasal saline spray or irrigation as well as time for symptoms to resolve.  Return and ER precautions provided.     No follow-ups on file.   Greig Ring, MD

## 2023-07-27 NOTE — Assessment & Plan Note (Signed)
 Acute, resolving Symptomatic care, rest and fluids.  Return and ER precautions provided.

## 2023-08-31 ENCOUNTER — Ambulatory Visit: Admitting: Family Medicine

## 2023-08-31 ENCOUNTER — Encounter: Payer: Self-pay | Admitting: Family Medicine

## 2023-08-31 VITALS — BP 138/86 | HR 105 | Ht 63.0 in | Wt 168.0 lb

## 2023-08-31 DIAGNOSIS — Z1331 Encounter for screening for depression: Secondary | ICD-10-CM | POA: Diagnosis not present

## 2023-08-31 DIAGNOSIS — Z1231 Encounter for screening mammogram for malignant neoplasm of breast: Secondary | ICD-10-CM | POA: Diagnosis not present

## 2023-08-31 DIAGNOSIS — R102 Pelvic and perineal pain: Secondary | ICD-10-CM

## 2023-08-31 DIAGNOSIS — Z01419 Encounter for gynecological examination (general) (routine) without abnormal findings: Secondary | ICD-10-CM | POA: Diagnosis not present

## 2023-08-31 NOTE — Progress Notes (Signed)
 Patient presents for Annual.  LMP: No LMP recorded. Patient has had an ablation.  Last pap: Date: abnormal -ASCUS-  Contraception: Post-menopausal Mammogram: Up to date: 10/2022 STD Screening: Declines Flu Vaccine : N/A  CC: annual   Right sided lower pain- random at times/ dull pain

## 2023-08-31 NOTE — Assessment & Plan Note (Signed)
 Unclear etiology. Check u/s to r/o ovarian pathology.

## 2023-08-31 NOTE — Progress Notes (Signed)
 Subjective:     Janet Porter is a 58 y.o. female and is here for a comprehensive physical exam. The patient reports problems - pain. Right sided pelvic pain x 1 month. Pain comes and goes, sometimes it is worse. Worse with movement. Unsure etiology.   The following portions of the patient's history were reviewed and updated as appropriate: allergies, current medications, past family history, past medical history, past social history, past surgical history, and problem list.  Review of Systems Pertinent items noted in HPI and remainder of comprehensive ROS otherwise negative.   Objective:  Chaperone present for exam   BP 138/86   Pulse (!) 105   Ht 5' 3 (1.6 m)   Wt 168 lb (76.2 kg)   BMI 29.76 kg/m  General appearance: alert, cooperative, and appears stated age Head: Normocephalic, without obvious abnormality, atraumatic Neck: no adenopathy, supple, symmetrical, trachea midline, and thyroid  not enlarged, symmetric, no tenderness/mass/nodules Lungs: clear to auscultation bilaterally Breasts: normal appearance, no masses or tenderness Heart: regular rate and rhythm, S1, S2 normal, no murmur, click, rub or gallop Abdomen: soft, non-tender; bowel sounds normal; no masses,  no organomegaly slightly tender RLQ Extremities: extremities normal, atraumatic, no cyanosis or edema Pulses: 2+ and symmetric Skin: Skin color, texture, turgor normal. No rashes or lesions Lymph nodes: Cervical, supraclavicular, and axillary nodes normal. Neurologic: Grossly normal    Assessment:    GYN female exam.      Plan:   Problem List Items Addressed This Visit       Unprioritized   Pelvic pain   Unclear etiology. Check u/s to r/o ovarian pathology.      Relevant Orders   US  PELVIC COMPLETE WITH TRANSVAGINAL   Other Visit Diagnoses       Encounter for gynecological examination without abnormal finding    -  Primary   Pap due 2026/2027     Screening mammogram for breast cancer        orders placed - due 10/2023   Relevant Orders   MM 3D SCREENING MAMMOGRAM BILATERAL BREAST         See After Visit Summary for Counseling Recommendations

## 2023-09-01 ENCOUNTER — Ambulatory Visit
Admission: RE | Admit: 2023-09-01 | Discharge: 2023-09-01 | Disposition: A | Discharge status: Home / Self Care | Source: Ambulatory Visit | Attending: Family Medicine | Admitting: Family Medicine

## 2023-09-01 DIAGNOSIS — R102 Pelvic and perineal pain: Secondary | ICD-10-CM

## 2023-09-15 ENCOUNTER — Ambulatory Visit: Payer: Self-pay | Admitting: Family Medicine

## 2023-10-18 ENCOUNTER — Other Ambulatory Visit: Payer: Self-pay | Admitting: Medical Genetics

## 2023-10-20 ENCOUNTER — Ambulatory Visit

## 2023-10-24 ENCOUNTER — Ambulatory Visit
Admission: RE | Admit: 2023-10-24 | Discharge: 2023-10-24 | Disposition: A | Source: Ambulatory Visit | Attending: Family Medicine | Admitting: Family Medicine

## 2023-10-24 DIAGNOSIS — Z1231 Encounter for screening mammogram for malignant neoplasm of breast: Secondary | ICD-10-CM

## 2023-10-27 ENCOUNTER — Other Ambulatory Visit: Payer: Self-pay | Admitting: Family Medicine

## 2023-10-27 DIAGNOSIS — R928 Other abnormal and inconclusive findings on diagnostic imaging of breast: Secondary | ICD-10-CM

## 2023-10-31 ENCOUNTER — Other Ambulatory Visit
Admission: RE | Admit: 2023-10-31 | Discharge: 2023-10-31 | Disposition: A | Discharge status: Home / Self Care | Payer: Self-pay | Source: Ambulatory Visit | Attending: Medical Genetics | Admitting: Medical Genetics

## 2023-11-06 ENCOUNTER — Ambulatory Visit
Admission: RE | Admit: 2023-11-06 | Discharge: 2023-11-06 | Disposition: A | Discharge status: Home / Self Care | Source: Ambulatory Visit | Attending: Family Medicine | Admitting: Family Medicine

## 2023-11-06 ENCOUNTER — Ambulatory Visit
Admission: RE | Admit: 2023-11-06 | Discharge: 2023-11-06 | Disposition: A | Source: Ambulatory Visit | Attending: Family Medicine | Admitting: Family Medicine

## 2023-11-06 ENCOUNTER — Other Ambulatory Visit: Payer: Self-pay | Admitting: Family Medicine

## 2023-11-06 DIAGNOSIS — R928 Other abnormal and inconclusive findings on diagnostic imaging of breast: Secondary | ICD-10-CM

## 2023-11-06 DIAGNOSIS — N6310 Unspecified lump in the right breast, unspecified quadrant: Secondary | ICD-10-CM

## 2023-11-10 LAB — GENECONNECT MOLECULAR SCREEN: Genetic Analysis Overall Interpretation: NEGATIVE

## 2023-11-17 ENCOUNTER — Other Ambulatory Visit: Payer: Self-pay | Admitting: Family Medicine

## 2024-02-21 ENCOUNTER — Other Ambulatory Visit: Payer: Self-pay | Admitting: Family Medicine

## 2024-02-21 DIAGNOSIS — R7303 Prediabetes: Secondary | ICD-10-CM

## 2024-02-21 DIAGNOSIS — E7800 Pure hypercholesterolemia, unspecified: Secondary | ICD-10-CM

## 2024-02-21 NOTE — Telephone Encounter (Signed)
Please schedule CPE with fasting labs prior with Dr. Ermalene Searing.  Send back to me once scheduled to refill medication.

## 2024-02-21 NOTE — Telephone Encounter (Signed)
 Called and schedule pt

## 2024-02-28 ENCOUNTER — Other Ambulatory Visit

## 2024-03-06 ENCOUNTER — Encounter: Admitting: Family Medicine

## 2024-05-07 ENCOUNTER — Other Ambulatory Visit

## 2024-05-07 ENCOUNTER — Encounter
